# Patient Record
Sex: Male | Born: 1985 | Race: White | Hispanic: No | Marital: Single | Smoking: Never smoker
Health system: Southern US, Community
[De-identification: ages and names within clinical notes are randomized; demographics above are authoritative.]

## PROBLEM LIST (undated history)

## (undated) HISTORY — PX: CYSTECTOMY: SUR359

---
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
## 2007-07-07 ENCOUNTER — Emergency Department (HOSPITAL_COMMUNITY): Admission: EM | Admit: 2007-07-07 | Discharge: 2007-07-07 | Payer: Self-pay | Admitting: Family Medicine

## 2008-12-07 ENCOUNTER — Emergency Department (HOSPITAL_COMMUNITY): Admission: EM | Admit: 2008-12-07 | Discharge: 2008-12-07 | Payer: Self-pay | Admitting: Family Medicine

## 2009-03-29 ENCOUNTER — Emergency Department (HOSPITAL_COMMUNITY): Admission: EM | Admit: 2009-03-29 | Discharge: 2009-03-29 | Payer: Self-pay | Admitting: Family Medicine

## 2009-11-03 ENCOUNTER — Emergency Department (HOSPITAL_COMMUNITY): Admission: EM | Admit: 2009-11-03 | Discharge: 2009-11-03 | Payer: Self-pay | Admitting: Emergency Medicine

## 2010-03-09 ENCOUNTER — Emergency Department (HOSPITAL_COMMUNITY): Admission: EM | Admit: 2010-03-09 | Discharge: 2010-03-09 | Payer: Self-pay | Admitting: Emergency Medicine

## 2011-02-17 ENCOUNTER — Emergency Department (HOSPITAL_COMMUNITY): Payer: Managed Care, Other (non HMO)

## 2011-02-17 ENCOUNTER — Emergency Department (HOSPITAL_COMMUNITY)
Admission: EM | Admit: 2011-02-17 | Discharge: 2011-02-17 | Disposition: A | Discharge status: Home / Self Care | Payer: Managed Care, Other (non HMO) | Attending: Emergency Medicine | Admitting: Emergency Medicine

## 2011-02-17 DIAGNOSIS — Z23 Encounter for immunization: Secondary | ICD-10-CM | POA: Insufficient documentation

## 2011-02-17 DIAGNOSIS — W010XXA Fall on same level from slipping, tripping and stumbling without subsequent striking against object, initial encounter: Secondary | ICD-10-CM | POA: Insufficient documentation

## 2011-02-17 DIAGNOSIS — M25529 Pain in unspecified elbow: Secondary | ICD-10-CM | POA: Insufficient documentation

## 2011-02-17 DIAGNOSIS — S6990XA Unspecified injury of unspecified wrist, hand and finger(s), initial encounter: Secondary | ICD-10-CM | POA: Insufficient documentation

## 2011-02-17 DIAGNOSIS — Y9239 Other specified sports and athletic area as the place of occurrence of the external cause: Secondary | ICD-10-CM | POA: Insufficient documentation

## 2011-02-17 DIAGNOSIS — S59909A Unspecified injury of unspecified elbow, initial encounter: Secondary | ICD-10-CM | POA: Insufficient documentation

## 2011-02-17 DIAGNOSIS — Y92838 Other recreation area as the place of occurrence of the external cause: Secondary | ICD-10-CM | POA: Insufficient documentation

## 2011-02-17 DIAGNOSIS — Y9364 Activity, baseball: Secondary | ICD-10-CM | POA: Insufficient documentation

## 2011-02-20 NOTE — Consult Note (Signed)
  NAMEDUJUAN, STANKOWSKI NO.:  [id number]  MEDICAL RECORD NO.:  [id number]  LOCATION:  [facility]                         FACILITY:  [facility]  PHYSICIAN:  Georges Lynch. Tery Hoeger, M.D.DATE OF BIRTH:  11-May-1986  DATE OF CONSULTATION: DATE OF DISCHARGE:  02/17/2011                                CONSULTATION   He is 25 years old.  HISTORY OF PRESENT ILLNESS:  I was called today on him on emergency call.  I was called from the [employer] stating that this patient was our patient and he was being transferred to [facility] and also had dislocated left elbow, so I immediately came over to see him.  In fact, I first saw him in the registration area, brought him back, got immediate x-rays and there was no dislocation or fracture.  PAST MEDICAL HISTORY:  Past history is well-documented.  PHYSICAL EXAMINATION:  The physical exam from the orthopedic standpoint shows that he has a normal flexion and extension of his elbow, normal pronation and supination.  He is just little tender and has superficial abrasion over the extensor aspect of his ulna.  There is no deep lacerations.  He has a good radial pulse.  He has good circulation and good finger function.  No wrist or hand problems.  He has no shoulder injuries.  Multiple x-rays of the left elbow were negative.  IMPRESSION:  Contusion with superficial abrasion of left elbow.  TREATMENT:  We cleaned his elbow up with some Betadine, Neosporin dressing.  Ordered a tetanus toxoid shot 0.5 mL IM since his mother states she does not know when he had one last and he is not allergic. Gave him a sling and told him I would see him in the office Tuesday or prior do he has any problem.          ______________________________ Georges Lynch Darrelyn Hillock, M.D.     RAG/MEDQ  D:  02/17/2011  T:  02/17/2011  Job:  [id number]  Electronically Signed by Ranee Gosselin M.D. on 02/20/2011 02:44:15 PM

## 2011-04-01 ENCOUNTER — Inpatient Hospital Stay (INDEPENDENT_AMBULATORY_CARE_PROVIDER_SITE_OTHER)
Admission: RE | Admit: 2011-04-01 | Discharge: 2011-04-01 | Disposition: A | Discharge status: Home / Self Care | Payer: Managed Care, Other (non HMO) | Source: Ambulatory Visit | Attending: Family Medicine | Admitting: Family Medicine

## 2011-04-01 DIAGNOSIS — R51 Headache: Secondary | ICD-10-CM

## 2012-06-12 ENCOUNTER — Emergency Department (INDEPENDENT_AMBULATORY_CARE_PROVIDER_SITE_OTHER)
Admission: EM | Admit: 2012-06-12 | Discharge: 2012-06-12 | Disposition: A | Discharge status: Nursing Home (Includes ICF) | Payer: Managed Care, Other (non HMO) | Source: Home / Self Care | Attending: Family Medicine | Admitting: Family Medicine

## 2012-06-12 ENCOUNTER — Encounter (HOSPITAL_COMMUNITY): Payer: Self-pay | Admitting: Family Medicine

## 2012-06-12 ENCOUNTER — Emergency Department (HOSPITAL_COMMUNITY)
Admission: EM | Admit: 2012-06-12 | Discharge: 2012-06-12 | Disposition: A | Discharge status: Home / Self Care | Payer: Managed Care, Other (non HMO) | Attending: Emergency Medicine | Admitting: Emergency Medicine

## 2012-06-12 ENCOUNTER — Encounter (HOSPITAL_COMMUNITY): Payer: Self-pay | Admitting: Emergency Medicine

## 2012-06-12 DIAGNOSIS — R109 Unspecified abdominal pain: Secondary | ICD-10-CM

## 2012-06-12 DIAGNOSIS — R1033 Periumbilical pain: Secondary | ICD-10-CM | POA: Insufficient documentation

## 2012-06-12 LAB — COMPREHENSIVE METABOLIC PANEL
ALT: 19 U/L (ref 0–53)
AST: 21 U/L (ref 0–37)
CO2: 27 mEq/L (ref 19–32)
Calcium: 10.9 mg/dL — ABNORMAL HIGH (ref 8.4–10.5)
Chloride: 101 mEq/L (ref 96–112)
Creatinine, Ser: 0.82 mg/dL (ref 0.50–1.35)
GFR calc Af Amer: >90 mL/min (ref >90)
GFR calc non Af Amer: >90 mL/min (ref >90)
Glucose, Bld: 97 mg/dL (ref 70–99)
Total Bilirubin: 0.8 mg/dL (ref 0.3–1.2)

## 2012-06-12 LAB — CBC WITH DIFFERENTIAL/PLATELET
Basophils Absolute: 0 10*3/uL (ref 0.0–0.1)
Eosinophils Relative: 0 % (ref 0–5)
HCT: 47.7 % (ref 39.0–52.0)
Hemoglobin: 17.2 g/dL — ABNORMAL HIGH (ref 13.0–17.0)
Lymphocytes Relative: 18 % (ref 12–46)
Lymphs Abs: 1.4 10*3/uL (ref 0.7–4.0)
MCV: 81.8 fL (ref 78.0–100.0)
Monocytes Absolute: 0.5 10*3/uL (ref 0.1–1.0)
Neutro Abs: 6 10*3/uL (ref 1.7–7.7)
RBC: 5.83 MIL/uL — ABNORMAL HIGH (ref 4.22–5.81)
WBC: 8 10*3/uL (ref 4.0–10.5)

## 2012-06-12 LAB — URINALYSIS, ROUTINE W REFLEX MICROSCOPIC
Glucose, UA: NEGATIVE mg/dL
Hgb urine dipstick: NEGATIVE
Leukocytes, UA: NEGATIVE
Specific Gravity, Urine: 1.014 (ref 1.005–1.030)
pH: 6.5 (ref 5.0–8.0)

## 2012-06-12 MED ORDER — HYDROCODONE-ACETAMINOPHEN 5-325 MG PO TABS: ORAL_TABLET | ORAL | Status: DC

## 2012-06-12 MED ORDER — ONDANSETRON 8 MG PO TBDP: 8.0000 mg | ORAL_TABLET | Freq: Three times a day (TID) | ORAL | Status: DC | PRN

## 2012-06-12 MED ORDER — ONDANSETRON 4 MG PO TBDP
8.0000 mg | ORAL_TABLET | Freq: Once | ORAL | Status: AC
  Administered 2012-06-12: 8 mg via ORAL
  Filled 2012-06-12: qty 2

## 2012-06-12 NOTE — ED Notes (Signed)
Pt presents to department for evaluation of diffuse abdominal pain and N/V/D. Onset yesterday afternoon @3 :00pm. Denies pain at the time. Denies urinary symptoms. No signs of distress noted at present.

## 2012-06-12 NOTE — ED Provider Notes (Signed)
History     CSN: [id number]  Arrival date & time 06/12/12  0830   First MD Initiated Contact with Patient 06/12/12 [phone]      Chief Complaint  Patient presents with  . Abdominal Pain    (Consider location/radiation/quality/duration/timing/severity/associated sxs/prior treatment) HPI Comments: 26 year old male no significant past medical history. Here complaining of periumbilical abdominal pain since last evening. Symptoms associated with nausea, one episode of emesis and one small nonbloody diarrhea last night. Decreased appetite, no food or fluid intake today due to nausea. Denies fever, dysuria or hematuria. Denies recent alcohol intake. Denies history of jaundice.     History reviewed. No pertinent past medical history.  History reviewed. No pertinent past surgical history.  History reviewed. No pertinent family history.  History  Substance Use Topics  . Smoking status: Never Smoker   . Smokeless tobacco: Not on file  . Alcohol Use: No      Review of Systems  Constitutional: Positive for appetite change. Negative for fever and chills.  HENT: Negative for sore throat.   Respiratory: Negative for cough and shortness of breath.   Gastrointestinal: Positive for nausea, vomiting, abdominal pain and diarrhea.  Genitourinary: Negative for dysuria and hematuria.  Musculoskeletal: Negative for back pain.  Skin: Negative for rash.  Neurological: Negative for dizziness and headaches.    Allergies  Review of patient's allergies indicates no known allergies.  Home Medications  No current outpatient prescriptions on file.  BP 132/97  Pulse 63  Temp 98.3 F (36.8 C) (Oral)  Resp 16  SpO2 100%  Physical Exam  Nursing note and vitals reviewed. Constitutional: He is oriented to person, place, and time. He appears well-developed and well-nourished.       Laying in bed.  HENT:  Mouth/Throat: Oropharynx is clear and moist. No oropharyngeal exudate.  Eyes: No scleral  icterus.  Neck: No JVD present.  Cardiovascular: Normal rate, regular rhythm and normal heart sounds.  Exam reveals no gallop and no friction rub.   No murmur heard. Pulmonary/Chest: Effort normal and breath sounds normal. No respiratory distress. He has no wheezes. He has no rales.  Abdominal: He exhibits no distension and no mass.       Abdomen is soft. Reported tenderness to deep palpation in periumbilical and tenderness and guarding in right periumbilical area and right lower quadrant. No rebound. Impress mildly increased BS.   Lymphadenopathy:    He has no cervical adenopathy.  Neurological: He is alert and oriented to person, place, and time.  Skin: No rash noted. He is not diaphoretic.    ED Course  Procedures (including critical care time)  Labs Reviewed - No data to display No results found.   1. Abdominal pain       MDM  26 year old male here complaining of periumbilical abdominal pain associated with nausea, one episode of emesis and one small nonbloody diarrhea last night. Decreased appetite, no food or fluid intake today due to nausea. On exam: Afebrile, stable vital signs. Tender to deep palpation in the periumbilical area with guarding in right periumbilical area and right lower quadrant. No rebound. Normal active bowel sounds. Decided to transfer to the emergency department for further evaluation and management.     Sharin Grave, MD 06/12/12 1020

## 2012-06-12 NOTE — ED Notes (Signed)
Pt c/o lower abdominal pain since yesterday evening at 3 p.m. Pt states that he feels extremely nauseas and has no appetite and not able to sleep. He has had some diarrhea that started last night. Pt denies fever and any other symptoms.  Pt is also concerned about a knot by his left ear. No pain or soreness, it comes and goes. Pt states that it has been a problem for a while now.

## 2012-06-12 NOTE — ED Notes (Signed)
Pt sent here from [facility] for further eval of abdominal pain N,D.

## 2012-06-12 NOTE — ED Provider Notes (Signed)
History     CSN: [id number]  Arrival date & time 06/12/12  1030   First MD Initiated Contact with Patient 06/12/12 1054      Chief Complaint  Patient presents with  . Abdominal Pain    (Consider location/radiation/quality/duration/timing/severity/associated sxs/prior treatment) HPI Comments: Patient presents with periumbilical abdominal pain that began last evening. His been associated with nausea, dry heaving, and 3-4 episodes of watery diarrhea. Patient denies history of abdominal surgeries. Patient denies fever, cold symptoms, chest pain or shortness of breath, urinary symptoms, hematuria. No treatments prior to arrival. Pain does not radiate. Onset gradual. Course is now gradually improving. Patient states that the pain is better than it was this morning.  The history is provided by the patient.    History reviewed. No pertinent past medical history.  History reviewed. No pertinent past surgical history.  History reviewed. No pertinent family history.  History  Substance Use Topics  . Smoking status: Never Smoker   . Smokeless tobacco: Not on file  . Alcohol Use: No      Review of Systems  Constitutional: Negative for fever.  HENT: Negative for sore throat and rhinorrhea.   Eyes: Negative for redness.  Respiratory: Negative for cough.   Cardiovascular: Negative for chest pain.  Gastrointestinal: Positive for nausea, abdominal pain and diarrhea. Negative for vomiting.  Genitourinary: Negative for dysuria and hematuria.  Musculoskeletal: Negative for myalgias.  Skin: Negative for rash.  Neurological: Negative for headaches.    Allergies  Review of patient's allergies indicates no known allergies.  Home Medications  No current outpatient prescriptions on file.  BP 146/89  Pulse 65  Temp 97.8 F (36.6 C) (Oral)  Resp 16  SpO2 100%  Physical Exam  Nursing note and vitals reviewed. Constitutional: He appears well-developed and well-nourished.  HENT:    Head: Normocephalic and atraumatic.  Eyes: Conjunctivae normal are normal. Right eye exhibits no discharge. Left eye exhibits no discharge.  Neck: Normal range of motion. Neck supple.  Cardiovascular: Normal rate, regular rhythm and normal heart sounds.   Pulmonary/Chest: Effort normal and breath sounds normal.  Abdominal: Soft. Bowel sounds are normal. There is tenderness. There is no rebound and no guarding.         Mild periumbilical pain and suprapubic pain on my exam. Neg psoas and internal obturator signs.   Neurological: He is alert.  Skin: Skin is warm and dry.  Psychiatric: He has a normal mood and affect.    ED Course  Procedures (including critical care time)  Labs Reviewed  CBC WITH DIFFERENTIAL - Abnormal; Notable for the following:    RBC 5.83 (*)     Hemoglobin 17.2 (*)     MCHC 36.1 (*)     All other components within normal limits  COMPREHENSIVE METABOLIC PANEL - Abnormal; Notable for the following:    Calcium 10.9 (*)     All other components within normal limits  URINALYSIS, ROUTINE W REFLEX MICROSCOPIC  LIPASE, BLOOD   No results found.   1. Abdominal pain     12:36 PM Patient seen and examined. Work-up initiated. Medications ordered.   Vital signs reviewed and are as follows: Filed Vitals:   06/12/12 1038  BP: 146/89  Pulse: 65  Temp: 97.8 F (36.6 C)  Resp: 16   12:54 PM Pt discussed with and seen by Dr. Estell Harpin. Will give zofran and PO challenge. Pt to return in 24 hrs for recheck or sooner if symptoms worsening. Patient is comfortable with  plan.   Discussed at length that abdominal pain can be an evolving process that needs close follow-up. Patient verbalizes understanding.   MDM  Abdominal pain -- non-localized, mild, improving. Labs reassuring. Exam is not very impressive. Feel patient is appropriate and reliable for 24 hour recheck. Feel probability of appendicitis, colitis is low, but not entirely ruled out. Pt given strict return  instructions and agrees to return if worsening.         Renne Crigler, [place] 06/12/12 1531

## 2012-06-14 NOTE — ED Provider Notes (Signed)
Medical screening examination/treatment/procedure(s) were conducted as a shared visit with non-physician practitioner(s) and myself.  I personally evaluated the patient during the encounter Pt with abd pain.  pe mild periumbilical tenderness  Benny Lennert, MD 06/14/12 1622

## 2016-12-10 ENCOUNTER — Emergency Department (HOSPITAL_COMMUNITY)
Admission: EM | Admit: 2016-12-10 | Discharge: 2016-12-10 | Disposition: A | Discharge status: Home / Self Care | Payer: Managed Care, Other (non HMO) | Attending: Emergency Medicine | Admitting: Emergency Medicine

## 2016-12-10 ENCOUNTER — Encounter (HOSPITAL_COMMUNITY): Payer: Self-pay | Admitting: Emergency Medicine

## 2016-12-10 ENCOUNTER — Emergency Department (HOSPITAL_COMMUNITY): Payer: Managed Care, Other (non HMO)

## 2016-12-10 DIAGNOSIS — R4182 Altered mental status, unspecified: Secondary | ICD-10-CM | POA: Insufficient documentation

## 2016-12-10 DIAGNOSIS — R569 Unspecified convulsions: Secondary | ICD-10-CM

## 2016-12-10 LAB — CBC
HEMATOCRIT: 42.7 % (ref 39.0–52.0)
HEMOGLOBIN: 14.8 g/dL (ref 13.0–17.0)
MCH: 28.6 pg (ref 26.0–34.0)
MCHC: 34.7 g/dL (ref 30.0–36.0)
MCV: 82.6 fL (ref 78.0–100.0)
Platelets: 209 10*3/uL (ref 150–400)
RBC: 5.17 MIL/uL (ref 4.22–5.81)
RDW: 12.6 % (ref 11.5–15.5)
WBC: 4.7 10*3/uL (ref 4.0–10.5)

## 2016-12-10 LAB — RAPID URINE DRUG SCREEN, HOSP PERFORMED
AMPHETAMINES: NOT DETECTED
BENZODIAZEPINES: NOT DETECTED
Barbiturates: NOT DETECTED
Cocaine: NOT DETECTED
OPIATES: NOT DETECTED
Tetrahydrocannabinol: NOT DETECTED

## 2016-12-10 LAB — URINALYSIS, ROUTINE W REFLEX MICROSCOPIC
Bilirubin Urine: NEGATIVE
GLUCOSE, UA: NEGATIVE mg/dL
HGB URINE DIPSTICK: NEGATIVE
Ketones, ur: NEGATIVE mg/dL
Leukocytes, UA: NEGATIVE
Nitrite: NEGATIVE
PH: 7 (ref 5.0–8.0)
PROTEIN: NEGATIVE mg/dL
SPECIFIC GRAVITY, URINE: 1.004 — AB (ref 1.005–1.030)

## 2016-12-10 LAB — BASIC METABOLIC PANEL
ANION GAP: 8 (ref 5–15)
BUN: <5 mg/dL — ABNORMAL LOW (ref 6–20)
CHLORIDE: 104 mmol/L (ref 101–111)
CO2: 25 mmol/L (ref 22–32)
CREATININE: 0.88 mg/dL (ref 0.61–1.24)
Calcium: 9.6 mg/dL (ref 8.9–10.3)
GFR calc non Af Amer: >60 mL/min (ref >60)
Glucose, Bld: 95 mg/dL (ref 65–99)
POTASSIUM: 4 mmol/L (ref 3.5–5.1)
Sodium: 137 mmol/L (ref 135–145)

## 2016-12-10 LAB — ETHANOL: Alcohol, Ethyl (B): <5 mg/dL (ref <5)

## 2016-12-10 MED ORDER — SODIUM CHLORIDE 0.9 % IV BOLUS (SEPSIS)
1000.0000 mL | Freq: Once | INTRAVENOUS | Status: AC
  Administered 2016-12-10: 1000 mL via INTRAVENOUS

## 2016-12-10 MED ORDER — ACETAMINOPHEN 500 MG PO TABS
1000.0000 mg | ORAL_TABLET | Freq: Once | ORAL | Status: AC
  Administered 2016-12-10: 1000 mg via ORAL
  Filled 2016-12-10: qty 2

## 2016-12-10 NOTE — ED Triage Notes (Signed)
Patient from work with [employer] after bystander states he was in the bathroom on his knees after "getting some bad news" and he fell over and started seizing.  Patient denies having a history of seizures.  On arrival patient was not responding to voice or touch and his limbs were flaccid.  After painful stimulus patient awoke immediately and asked "what happened?  I don't remember what happened."  Patient tearful at this time, alert and oriented to self.

## 2016-12-10 NOTE — ED Notes (Signed)
Pt tolerated PO fluids well  

## 2016-12-10 NOTE — ED Provider Notes (Signed)
[facility]-EMERGENCY DEPT Provider Note   CSN: [id number] Arrival date & time: 12/10/16  1150     History   Chief Complaint Chief Complaint  Patient presents with  . Seizures    HPI Clinton Tucker is a 31 y.o. male.  HPI   Patient brought in following witnessed seizure.  Per EMS pt was on his knees in bathroom after receiving bad news, he then fell over and seized.  Mother is with pt in room.  Pt reports he doesn't remember anything after going to work this morning.  He works at a call center.  Denies any recent illness, denies pain anywhere.  Denies using drugs or alcohol.   Level V caveat for possible post-ictal state.    History reviewed. No pertinent past medical history.  There are no active problems to display for this patient.   History reviewed. No pertinent surgical history.     Home Medications    Prior to Admission medications   Medication Sig Start Date End Date Taking? Authorizing Provider  HYDROcodone-acetaminophen (NORCO/VICODIN) 5-325 MG per tablet Take 1-2 tablets every 6 hours as needed for severe pain Patient not taking: Reported on 12/10/2016 06/12/12   Renne Crigler, PA-C  ondansetron (ZOFRAN ODT) 8 MG disintegrating tablet Take 1 tablet (8 mg total) by mouth every 8 (eight) hours as needed for nausea. Patient not taking: Reported on 12/10/2016 06/12/12   Renne Crigler, PA-C    Family History No family history on file.  Social History Social History  Substance Use Topics  . Smoking status: Never Smoker  . Smokeless tobacco: Not on file  . Alcohol use No     Allergies   Patient has no known allergies.   Review of Systems Review of Systems  Unable to perform ROS: Mental status change     Physical Exam Updated Vital Signs BP 140/84   Pulse 68   Temp 98.8 F (37.1 C) (Oral)   Resp 16   SpO2 100%   Physical Exam  Constitutional: He appears well-developed and well-nourished. No distress.  HENT:  Head: Normocephalic and atraumatic.  Neck:  Neck supple.  Cardiovascular: Normal rate and regular rhythm.   Pulmonary/Chest: Effort normal and breath sounds normal. No respiratory distress. He has no wheezes. He has no rales.  Abdominal: Soft. He exhibits no distension and no mass. There is no tenderness. There is no rebound and no guarding.  Neurological: He is alert. He exhibits normal muscle tone.  Does not know where he is,  Does know he is in [place].  Thinks it is Wednesday, October, 2014.    Skin: He is not diaphoretic.  Nursing note and vitals reviewed.    ED Treatments / Results  Labs (all labs ordered are listed, but only abnormal results are displayed) Labs Reviewed  BASIC METABOLIC PANEL - Abnormal; Notable for the following:       Result Value   BUN <5 (*)    All other components within normal limits  URINALYSIS, ROUTINE W REFLEX MICROSCOPIC - Abnormal; Notable for the following:    Color, Urine STRAW (*)    Specific Gravity, Urine 1.004 (*)    All other components within normal limits  CBC  RAPID URINE DRUG SCREEN, HOSP PERFORMED  ETHANOL    EKG  EKG Interpretation None       Radiology Ct Head Wo Contrast  Result Date: 12/10/2016 CLINICAL DATA:  First time seizure this morning. EXAM: CT HEAD WITHOUT CONTRAST CT CERVICAL SPINE WITHOUT CONTRAST  TECHNIQUE: Multidetector CT imaging of the head and cervical spine was performed following the standard protocol without intravenous contrast. Multiplanar CT image reconstructions of the cervical spine were also generated. COMPARISON:  03/09/2010 FINDINGS: CT HEAD FINDINGS Brain: There is no evidence for acute hemorrhage, hydrocephalus, mass lesion, or abnormal extra-axial fluid collection. No definite CT evidence for acute infarction. Vascular: No hyperdense vessel or unexpected calcification. Skull: No evidence for fracture. No worrisome lytic or sclerotic lesion. Sinuses/Orbits: The visualized paranasal sinuses and mastoid air cells are clear. Visualized portions  of the globes and intraorbital fat are unremarkable. Other: None. CT CERVICAL SPINE FINDINGS Alignment: Preserved from the skullbase through the T1 vertebral body. Skull base and vertebrae: No fracture from the skullbase to the C7-T1 interspace. No worrisome lytic or sclerotic osseous abnormality. Soft tissues and spinal canal: No prevertebral fluid or swelling. No visible canal hematoma. Disc levels: Intervertebral disc spaces are preserved. Facets are well aligned bilaterally. Upper chest: Not visualized. Other: None. IMPRESSION: 1. No acute intracranial abnormality. 2. No evidence for cervical spine fracture. Electronically Signed   By: Kennith Center M.D.   On: 12/10/2016 13:16   Ct Cervical Spine Wo Contrast  Result Date: 12/10/2016 CLINICAL DATA:  First time seizure this morning. EXAM: CT HEAD WITHOUT CONTRAST CT CERVICAL SPINE WITHOUT CONTRAST TECHNIQUE: Multidetector CT imaging of the head and cervical spine was performed following the standard protocol without intravenous contrast. Multiplanar CT image reconstructions of the cervical spine were also generated. COMPARISON:  03/09/2010 FINDINGS: CT HEAD FINDINGS Brain: There is no evidence for acute hemorrhage, hydrocephalus, mass lesion, or abnormal extra-axial fluid collection. No definite CT evidence for acute infarction. Vascular: No hyperdense vessel or unexpected calcification. Skull: No evidence for fracture. No worrisome lytic or sclerotic lesion. Sinuses/Orbits: The visualized paranasal sinuses and mastoid air cells are clear. Visualized portions of the globes and intraorbital fat are unremarkable. Other: None. CT CERVICAL SPINE FINDINGS Alignment: Preserved from the skullbase through the T1 vertebral body. Skull base and vertebrae: No fracture from the skullbase to the C7-T1 interspace. No worrisome lytic or sclerotic osseous abnormality. Soft tissues and spinal canal: No prevertebral fluid or swelling. No visible canal hematoma. Disc levels:  Intervertebral disc spaces are preserved. Facets are well aligned bilaterally. Upper chest: Not visualized. Other: None. IMPRESSION: 1. No acute intracranial abnormality. 2. No evidence for cervical spine fracture. Electronically Signed   By: Kennith Center M.D.   On: 12/10/2016 13:16    Procedures Procedures (including critical care time)  Medications Ordered in ED Medications  sodium chloride 0.9 % bolus 1,000 mL (1,000 mLs Intravenous New Bag/Given 12/10/16 1519)  acetaminophen (TYLENOL) tablet 1,000 mg (1,000 mg Oral Given 12/10/16 1519)  sodium chloride 0.9 % bolus 1,000 mL (1,000 mLs Intravenous New Bag/Given 12/10/16 1531)     Initial Impression / Assessment and Plan / ED Course  I have reviewed the triage vital signs and the nursing notes.  Pertinent labs & imaging results that were available during my care of the patient were reviewed by me and considered in my medical decision making (see chart for details).  Clinical Course as of Dec 10 1613  Mon Dec 10, 2016  1358 Pt sleeping - woke up with sternal rub only.  Oriented now to location, date.  Denies any pain or other concerns currently.    [EW]  1431 Attempted to walk pt.  He fell directly forward, grabbing me and going to his knees.  His brother assisted me in putting him back  on the stretcher.  He is tearful, mother explains the "bad news" he got was that she and his father are "fussing."   [EW]  1607 Pt seen by Dr Patria Mane and ambulated with Dr Patria Mane.  Pt to be d/c home.    [EW]    Clinical Course User Index [EW] Trixie Dredge, PA-C    Afebrile nontoxic patient with reported witnessed seizure.  Pt was postictal afterwards but cleared with time.  Workup unremarkable.  Pt initially unable to stand and HR increased with orthostatic VS but IVF given with symptomatic improvement.  Pt also seen by Dr Patria Mane and pt discussed with him.  Pt and family advised of restrictions including no driving, no swimming, no baths, no dangerous activity  until cleared by neurology.  D/C home with neurology follow up.  Discussed result, findings, treatment, and follow up  with patient and mother (with patient's consent).  Pt given return precautions.    Final Clinical Impressions(s) / ED Diagnoses   Final diagnoses:  Seizure-like activity [facility])    New Prescriptions New Prescriptions   No medications on file     [place], PA-C 12/10/16 1618    Azalia Bilis, MD 12/10/16 1701

## 2016-12-10 NOTE — Discharge Instructions (Signed)
Read the information below.  You may return to the Emergency Department at any time for worsening condition or any new symptoms that concern you.  Do not drive, swim, take a bath, or do any activities that would be dangerous if you were to fall or lose consciousness until cleared by a neurologist.  Please see two neurology offices listed above, you may make an appointment with either.

## 2016-12-10 NOTE — ED Notes (Signed)
Patient transported to CT 

## 2016-12-10 NOTE — ED Notes (Signed)
Pt left without d/c paperwork. D/c paperwork held at triage. Pt ambulated with steady gait prior to d/c for Dr. Patria Mane. No scripts to review.

## 2016-12-24 ENCOUNTER — Ambulatory Visit: Payer: Self-pay | Admitting: Diagnostic Neuroimaging

## 2017-02-18 ENCOUNTER — Ambulatory Visit: Payer: Managed Care, Other (non HMO) | Admitting: Neurology

## 2019-02-21 IMAGING — CT CT CERVICAL SPINE W/O CM
4 of 7 series · 13 of 33 positions shown, 14 images · non-contrast
Comparison: 03/09/2010

CLINICAL DATA: First time seizure this morning.

EXAM:
CT HEAD WITHOUT CONTRAST
CT CERVICAL SPINE WITHOUT CONTRAST
TECHNIQUE: Multidetector CT imaging of the head and cervical spine was
performed following the standard protocol without intravenous
contrast. Multiplanar CT image reconstructions of the cervical spine
were also generated.

[Series 9: c_spine 2.0 st · axial · 0.26mm/px · z∈[-208,-100]mm · 4 of 91 slices shown, 5 images]
[im 19/91  soft-tissue]
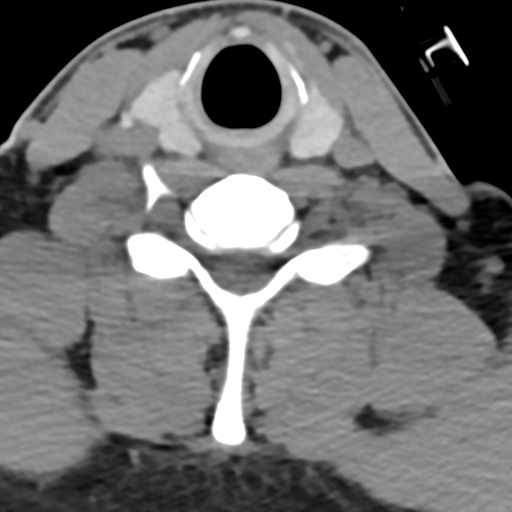
[im 19/91  bone]
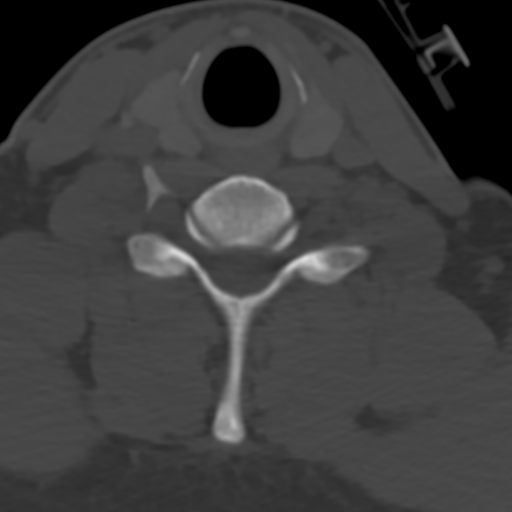
[im 37/91  bone]
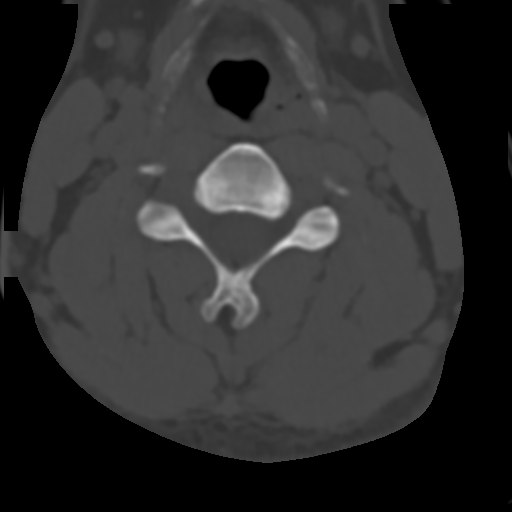
[im 55/91  bone]
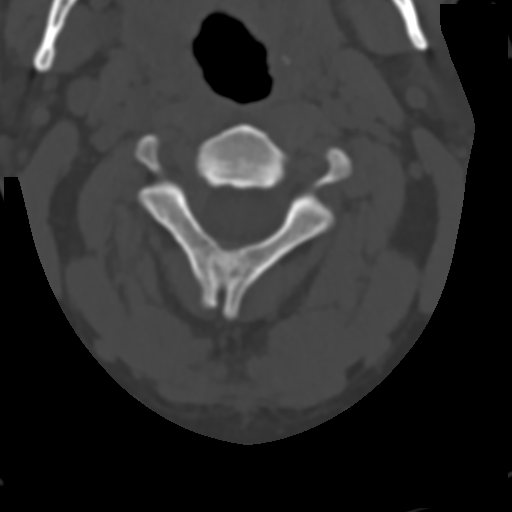
[im 73/91  bone]
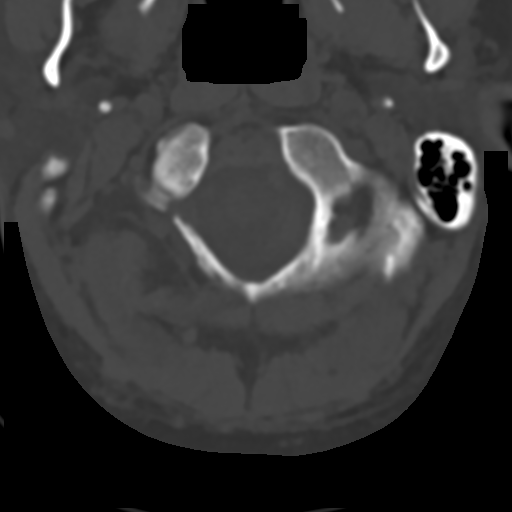

[Series 10: coronal bone · coronal · 0.23mm/px · 1 of 61 slices shown]
[im 31/61  bone]
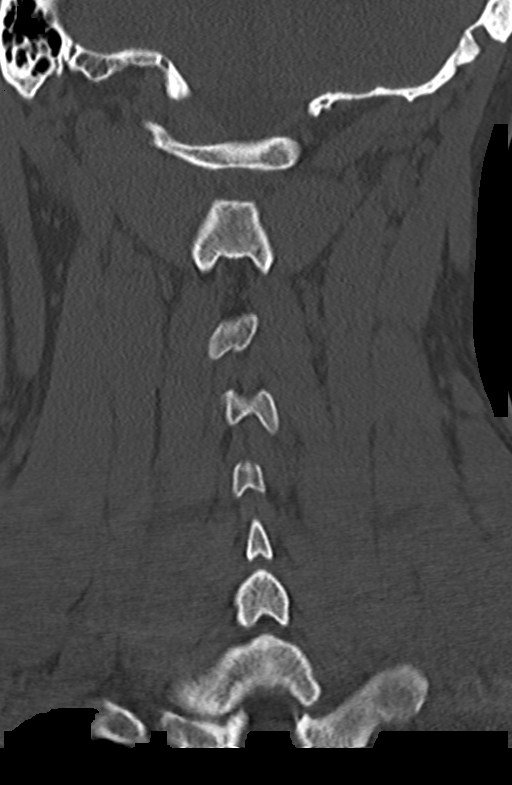

[Series 14: orthogonal axial st · axial · 0.21mm/px · z∈[-231,-134]mm · 4 of 84 slices shown]
[im 17/84  bone]
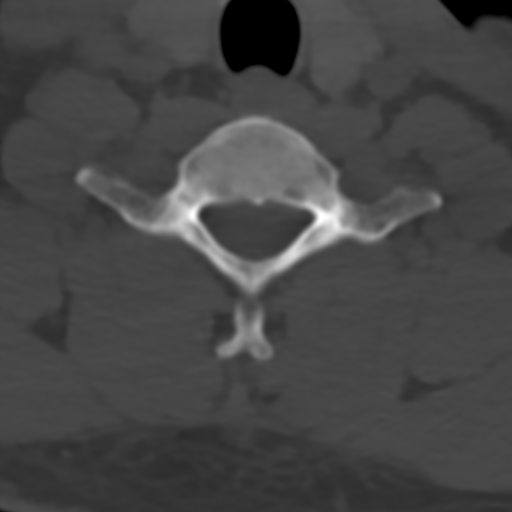
[im 34/84  bone]
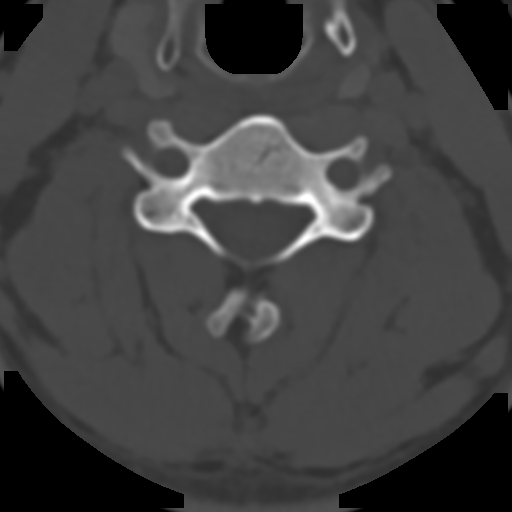
[im 50/84  bone]
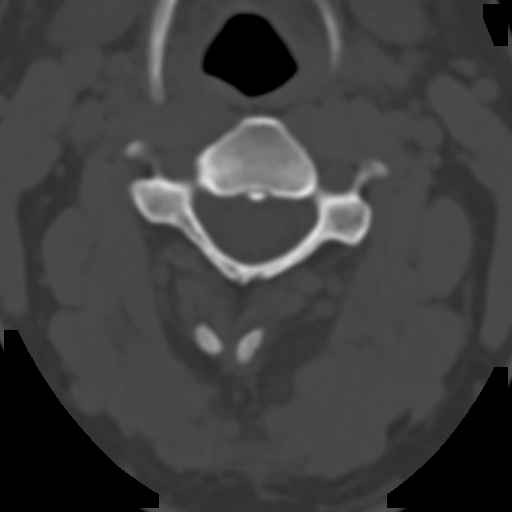
[im 67/84  bone]
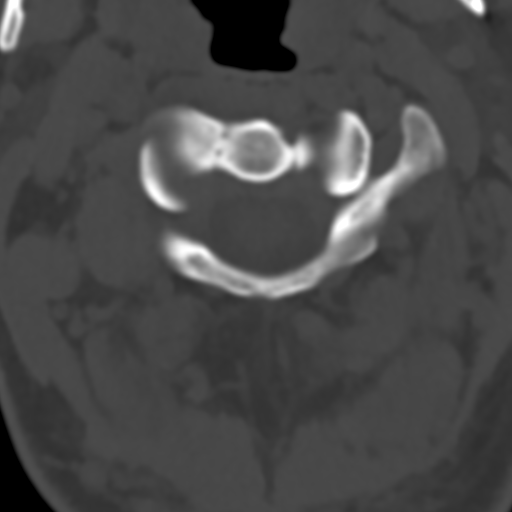

[Series 15: sagittal bone · sagittal · 0.23mm/px · 4 of 61 slices shown]
[im 13/61  bone]
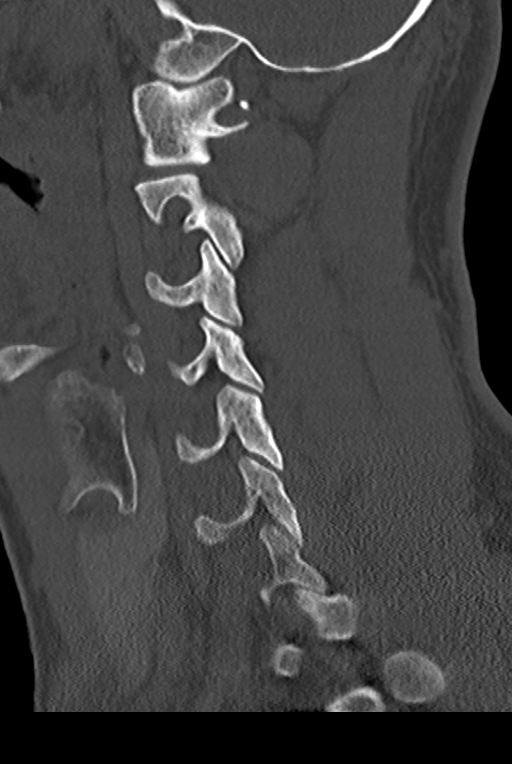
[im 25/61  bone]
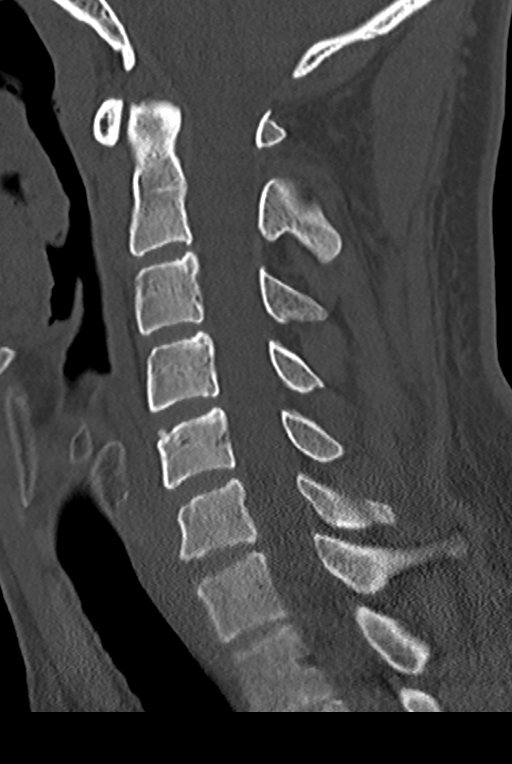
[im 37/61  bone]
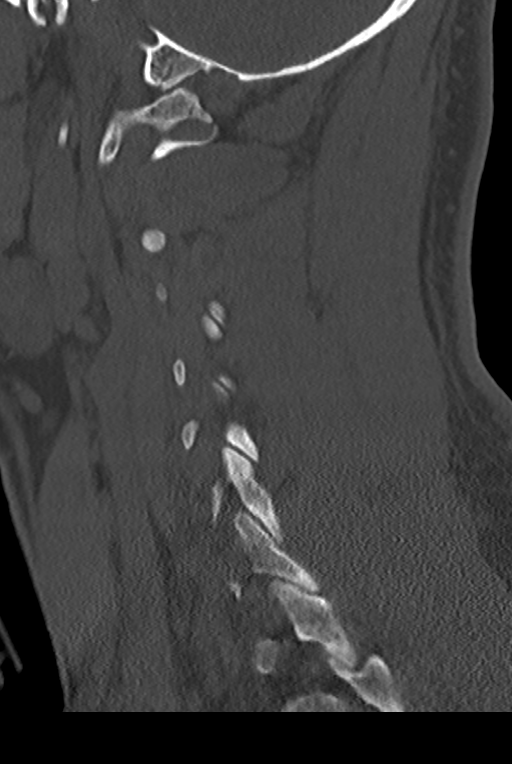
[im 49/61  bone]
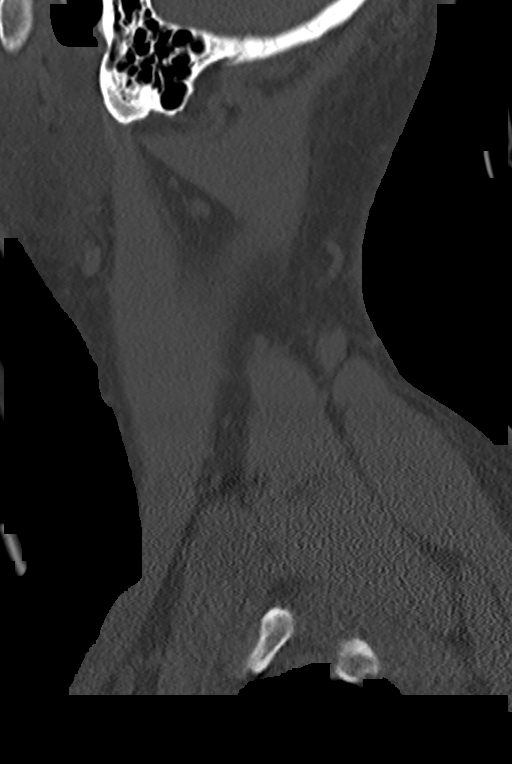

[13 of 33 positions shown; findings below may reference images not displayed]

FINDINGS: CT HEAD FINDINGS

Brain: There is no evidence for acute hemorrhage, hydrocephalus,
mass lesion, or abnormal extra-axial fluid collection. No definite
CT evidence for acute infarction.

Vascular: No hyperdense vessel or unexpected calcification.

Skull: No evidence for fracture. No worrisome lytic or sclerotic
lesion.

Sinuses/Orbits: The visualized paranasal sinuses and mastoid air
cells are clear. Visualized portions of the globes and intraorbital
fat are unremarkable.

Other: None.

CT CERVICAL SPINE FINDINGS

Alignment: Preserved from the skullbase through the T1 vertebral
body.

Skull base and vertebrae: No fracture from the skullbase to the
C7-T1 interspace. No worrisome lytic or sclerotic osseous
abnormality.

Soft tissues and spinal canal: No prevertebral fluid or swelling. No
visible canal hematoma.

Disc levels: Intervertebral disc spaces are preserved. Facets are
well aligned bilaterally.

Upper chest: Not visualized.

Other: None.
IMPRESSION: 1. No acute intracranial abnormality.
2. No evidence for cervical spine fracture.

## 2019-11-09 ENCOUNTER — Encounter (HOSPITAL_COMMUNITY): Payer: Self-pay

## 2019-11-09 ENCOUNTER — Other Ambulatory Visit: Payer: Self-pay

## 2019-11-09 ENCOUNTER — Ambulatory Visit (HOSPITAL_COMMUNITY)
Admission: EM | Admit: 2019-11-09 | Discharge: 2019-11-09 | Disposition: A | Discharge status: Home / Self Care | Payer: BC Managed Care – PPO | Attending: Family Medicine | Admitting: Family Medicine

## 2019-11-09 DIAGNOSIS — B9689 Other specified bacterial agents as the cause of diseases classified elsewhere: Secondary | ICD-10-CM

## 2019-11-09 DIAGNOSIS — L089 Local infection of the skin and subcutaneous tissue, unspecified: Secondary | ICD-10-CM

## 2019-11-09 DIAGNOSIS — L720 Epidermal cyst: Secondary | ICD-10-CM | POA: Diagnosis not present

## 2019-11-09 MED ORDER — DOXYCYCLINE HYCLATE 100 MG PO CAPS: 100.0000 mg | ORAL_CAPSULE | Freq: Two times a day (BID) | ORAL | 0 refills | Status: DC

## 2019-11-09 NOTE — ED Provider Notes (Signed)
[place]    CSN: [id number] Arrival date & time: 11/09/19  [place]      History   Chief Complaint Chief Complaint  Patient presents with  . Abscess    HPI Clinton Tucker is a 34 y.o. male.   HPI Patient has a cyst in front of his left ear this been present about 10 years.  He has had as well as abdomen that he says has been there for many years as well.  He now has 1 on the right side of his nose that is swollen and painful.  Is causing redness and swelling on the right side of his face.  He thinks that he may have a skin infection.  No drainage.  No fever or chills.  History reviewed. No pertinent past medical history.  There are no problems to display for this patient.   History reviewed. No pertinent surgical history.     Home Medications    Prior to Admission medications   Medication Sig Start Date End Date Taking? Authorizing Provider  doxycycline (VIBRAMYCIN) 100 MG capsule Take 1 capsule (100 mg total) by mouth 2 (two) times daily. 11/09/19   Raylene Everts, MD    Family History Family History  Problem Relation Age of Onset  . Diabetes Mother   . Hypertension Mother   . Diabetes Father   . Hypertension Father     Social History Social History   Tobacco Use  . Smoking status: Never Smoker  . Smokeless tobacco: Never Used  Substance Use Topics  . Alcohol use: Yes  . Drug use: Never     Allergies   Patient has no known allergies.   Review of Systems Review of Systems  Skin: Positive for wound.     Physical Exam Triage Vital Signs ED Triage Vitals [11/09/19 1848]  Enc Vitals Group     BP (!) 146/103     Pulse Rate 88     Resp 18     Temp 98.8 F (37.1 C)     Temp Source Oral     SpO2 96 %     Weight      Height      Head Circumference      Peak Flow      Pain Score 4     Pain Loc      Pain Edu?      Excl. in [place]?    No data found.  Updated Vital Signs BP (!) 146/103 (BP Location: Right Arm)   Pulse 88   Temp  98.8 F (37.1 C) (Oral)   Resp 18   SpO2 96%   Visual Acuity Right Eye Distance:   Left Eye Distance:   Bilateral Distance:    Right Eye Near:   Left Eye Near:    Bilateral Near:     Physical Exam Constitutional:      General: He is not in acute distress.    Appearance: Normal appearance. He is well-developed.  HENT:     Head: Normocephalic and atraumatic.      Comments: Area of erythema, mild soft tissue swelling of the right cheek that is 3 x 4 cm.  Induration in the center.  No fluctuance.  Lesion cyst in front of the left ear, 2-1/2 cm across.  Soft.  No infection or erythema Eyes:     Conjunctiva/sclera: Conjunctivae normal.     Pupils: Pupils are equal, round, and reactive to light.  Cardiovascular:  Rate and Rhythm: Normal rate.  Pulmonary:     Effort: Pulmonary effort is normal. No respiratory distress.  Abdominal:     General: There is no distension.     Palpations: Abdomen is soft.  Musculoskeletal:        General: Normal range of motion.     Cervical back: Normal range of motion.  Skin:    General: Skin is warm and dry.  Neurological:     Mental Status: He is alert.      UC Treatments / Results  Labs (all labs ordered are listed, but only abnormal results are displayed) Labs Reviewed - No data to display  EKG   Radiology No results found.  Procedures Procedures (including critical care time)  Medications Ordered in UC Medications - No data to display  Initial Impression / Assessment and Plan / UC Course  I have reviewed the triage vital signs and the nursing notes.  Pertinent labs & imaging results that were available during my care of the patient were reviewed by me and considered in my medical decision making (see chart for details).     Reviewed that the inclusion cyst do not cause cancer.  They do not need to be removed unless they are cosmetically unappealing or large and uncomfortable. Final Clinical Impressions(s) / UC Diagnoses    Final diagnoses:  Epidermal inclusion cyst  Skin infection, bacterial     Discharge Instructions     Take the antibiotic 2 x a day See dermatology or surgeon for cyst removal Your blood pressure is mildly elevated.  I recommend following up with your primary care doctor    ED Prescriptions    Medication Sig Dispense Auth. Provider   doxycycline (VIBRAMYCIN) 100 MG capsule Take 1 capsule (100 mg total) by mouth 2 (two) times daily. 14 capsule Eustace Moore, MD     PDMP not reviewed this encounter.   Eustace Moore, MD 11/09/19 [phone]

## 2019-11-09 NOTE — ED Triage Notes (Signed)
Pt c/o abscess right side of nose/face x1 week with purulent, bloody drainage. Also c/o swelling to face left tragus area for approx 10 years that has drained in the past.  Also c/o hardened swelling/abscess to left umbilical area for several years. Denies fever, chills.

## 2019-11-09 NOTE — Discharge Instructions (Addendum)
Take the antibiotic 2 x a day See dermatology or surgeon for cyst removal Your blood pressure is mildly elevated.  I recommend following up with your primary care doctor

## 2020-06-23 ENCOUNTER — Ambulatory Visit (INDEPENDENT_AMBULATORY_CARE_PROVIDER_SITE_OTHER): Payer: BC Managed Care – PPO | Admitting: Otolaryngology

## 2020-06-23 ENCOUNTER — Other Ambulatory Visit: Payer: Self-pay

## 2020-06-23 ENCOUNTER — Encounter (INDEPENDENT_AMBULATORY_CARE_PROVIDER_SITE_OTHER): Payer: Self-pay | Admitting: Otolaryngology

## 2020-06-23 VITALS — Temp 97.7°F

## 2020-06-23 DIAGNOSIS — L720 Epidermal cyst: Secondary | ICD-10-CM | POA: Diagnosis not present

## 2020-06-23 NOTE — Progress Notes (Signed)
HPI: Clinton Tucker is a 34 y.o. male who presents is referred by [facility] dermatology for evaluation of left facial cyst.  Patient states that he has had a cyst on the left side of his face for several years but recently became infected and swelled up having to be drained in the ED.  He was treated with antibiotics.  He is referred here to have it possibly removed.  He is otherwise healthy..  No past medical history on file. No past surgical history on file. Social History   Socioeconomic History  . Marital status: Single    Spouse name: Not on file  . Number of children: Not on file  . Years of education: Not on file  . Highest education level: Not on file  Occupational History  . Not on file  Tobacco Use  . Smoking status: Never Smoker  . Smokeless tobacco: Never Used  Vaping Use  . Vaping Use: Never used  Substance and Sexual Activity  . Alcohol use: Yes  . Drug use: Never  . Sexual activity: Not on file  Other Topics Concern  . Not on file  Social History Narrative  . Not on file   Social Determinants of Health   Financial Resource Strain:   . Difficulty of Paying Living Expenses: Not on file  Food Insecurity:   . Worried About Programme researcher, broadcasting/film/video in the Last Year: Not on file  . Ran Out of Food in the Last Year: Not on file  Transportation Needs:   . Lack of Transportation (Medical): Not on file  . Lack of Transportation (Non-Medical): Not on file  Physical Activity:   . Days of Exercise per Week: Not on file  . Minutes of Exercise per Session: Not on file  Stress:   . Feeling of Stress : Not on file  Social Connections:   . Frequency of Communication with Friends and Family: Not on file  . Frequency of Social Gatherings with Friends and Family: Not on file  . Attends Religious Services: Not on file  . Active Member of Clubs or Organizations: Not on file  . Attends Banker Meetings: Not on file  . Marital Status: Not on file   Family History   Problem Relation Age of Onset  . Diabetes Mother   . Hypertension Mother   . Diabetes Father   . Hypertension Father    No Known Allergies Prior to Admission medications   Medication Sig Start Date End Date Taking? Authorizing Provider  doxycycline (VIBRAMYCIN) 100 MG capsule Take 1 capsule (100 mg total) by mouth 2 (two) times daily. Patient not taking: Reported on 06/23/2020 11/09/19   Eustace Moore, MD     Positive ROS: Otherwise negative  All other systems have been reviewed and were otherwise negative with the exception of those mentioned in the HPI and as above.  Physical Exam: Constitutional: Alert, well-appearing, no acute distress Ears: External ears without lesions or tenderness. Ear canals are clear bilaterally with intact, clear TMs.  Nasal: External nose without lesions. Clear nasal passages Oral: Lips and gums without lesions. Tongue and palate mucosa without lesions. Posterior oropharynx clear. Neck: No palpable adenopathy or masses Respiratory: Breathing comfortably  Skin: No facial/neck lesions or rash noted.  Patient has a cyst in the left cheek area left temple area measuring approximately 2 cm is fairly flat presently has is been recently drained.  Still has slight induration.  No real signs of persistent infection.  Procedures  Assessment: Left epidermal cyst consistent with sebaceous cyst  Plan: Discussed excision of this under local anesthetic in 2 weeks. He will follow-up in a week and a half for recheck on the Wednesday before probable Friday excision. Gave him a prescription for doxycycline to use if he develops any erythema or swelling.   Narda Bonds, MD   CC:

## 2020-07-06 ENCOUNTER — Ambulatory Visit (INDEPENDENT_AMBULATORY_CARE_PROVIDER_SITE_OTHER): Payer: BC Managed Care – PPO | Admitting: Otolaryngology

## 2020-07-06 ENCOUNTER — Encounter (INDEPENDENT_AMBULATORY_CARE_PROVIDER_SITE_OTHER): Payer: Self-pay | Admitting: Otolaryngology

## 2020-07-06 ENCOUNTER — Other Ambulatory Visit: Payer: Self-pay

## 2020-07-06 DIAGNOSIS — L723 Sebaceous cyst: Secondary | ICD-10-CM | POA: Diagnosis not present

## 2020-07-06 NOTE — Progress Notes (Signed)
HPI: Clinton Tucker is a 34 y.o. male who returns today for evaluation of left facial cyst.  He had I&D performed at the ED a couple weeks ago.  He is doing much better presently.  He is having no pain and no swelling.Marland Kitchen  No past medical history on file. No past surgical history on file. Social History   Socioeconomic History  . Marital status: Single    Spouse name: Not on file  . Number of children: Not on file  . Years of education: Not on file  . Highest education level: Not on file  Occupational History  . Not on file  Tobacco Use  . Smoking status: Never Smoker  . Smokeless tobacco: Never Used  Vaping Use  . Vaping Use: Never used  Substance and Sexual Activity  . Alcohol use: Yes  . Drug use: Never  . Sexual activity: Not on file  Other Topics Concern  . Not on file  Social History Narrative  . Not on file   Social Determinants of Health   Financial Resource Strain:   . Difficulty of Paying Living Expenses: Not on file  Food Insecurity:   . Worried About Programme researcher, broadcasting/film/video in the Last Year: Not on file  . Ran Out of Food in the Last Year: Not on file  Transportation Needs:   . Lack of Transportation (Medical): Not on file  . Lack of Transportation (Non-Medical): Not on file  Physical Activity:   . Days of Exercise per Week: Not on file  . Minutes of Exercise per Session: Not on file  Stress:   . Feeling of Stress : Not on file  Social Connections:   . Frequency of Communication with Friends and Family: Not on file  . Frequency of Social Gatherings with Friends and Family: Not on file  . Attends Religious Services: Not on file  . Active Member of Clubs or Organizations: Not on file  . Attends Banker Meetings: Not on file  . Marital Status: Not on file   Family History  Problem Relation Age of Onset  . Diabetes Mother   . Hypertension Mother   . Diabetes Father   . Hypertension Father    No Known Allergies Prior to Admission medications    Medication Sig Start Date End Date Taking? Authorizing Provider  doxycycline (VIBRAMYCIN) 100 MG capsule Take 1 capsule (100 mg total) by mouth 2 (two) times daily. 11/09/19  Yes Eustace Moore, MD     Positive ROS: Otherwise negative  All other systems have been reviewed and were otherwise negative with the exception of those mentioned in the HPI and as above.  Physical Exam: Constitutional: Alert, well-appearing, no acute distress Ears: External ears without lesions or tenderness. Ear canals are clear bilaterally with intact, clear TMs.  Nasal: External nose without lesions. . Clear nasal passages Oral: Lips and gums without lesions. Tongue and palate mucosa without lesions. Posterior oropharynx clear. Neck: No palpable adenopathy or masses Respiratory: Breathing comfortably  Skin: No facial/neck lesions or rash noted.  On exam of the left face left temple area the incision site is well-healed with slight induration.  Has a small cyst inferior to this again which is fairly flat and slightly indurated.  This is nontender to palpation is not swollen.  No signs of infection.  Procedures  Assessment: Status post I&D of left facial cyst.  Doing well with no signs of infection  Plan: At this point most of this  represents scar tissue and there is no swelling or signs of infection would not recommend excision of this unless it enlarges.  He will call us back if it enlarges for excision prior to any infection and discussed this with him.   Narda Bonds, MD

## 2020-07-11 ENCOUNTER — Ambulatory Visit: Payer: Self-pay | Admitting: Surgery

## 2020-07-11 NOTE — H&P (Signed)
Clinton Tucker Appointment: 07/11/2020 2:30 PM Location: [facility] Patient #: [id number] DOB: [birth year] Single / Language: [place] / Race: White Male  History of Present Illness Clinton Sportsman MD; 07/11/2020 2:57 PM) The patient is a 34 year old male who presents with a soft tissue mass. Note for "Soft tissue mass": ` ` ` Patient sent for surgical consultation at the request of [facility]  Chief Complaint: Subcutaneous masses. ` ` The patient is a young male. Some anxiety. Wished his mother to be with him for the office visit. Patient has had a mass on his abdomen for the past 4 years. It has been slowly getting larger over time. He recently had a pectus cyst on his left cheek in front of his ear that required incision and drainage and emergency room with follow-up by Clinton Tucker with your nose and throat. Seems to be healing. Patient noted he had mass in his abdominal wall. Was sent to dermatology. They felt too large and deep to be excised the office. Surgical consultation offered. Patient has any history of any other infections. Does not smoke. No diabetes. Can walk a half hour without difficulty. No cardiac upon issues. Had an episode of head trauma and sports in 2015. Had 1 seizure during and emotionally stressful time does not have a history of chronic seizure disorders.   (Review of systems as stated in this history (HPI) or in the review of systems. Otherwise all other 12 point ROS are negative) ` ` ###########################################`  This patient encounter took 25 minutes today to perform the following: obtain history, perform exam, review outside records, interpret tests & imaging, counsel the patient on their diagnosis; and, document this encounter, including findings & plan in the electronic health record (EHR).   Past Surgical History (Clinton Tucker, CMA; 07/11/2020 2:28 PM) No pertinent past surgical  history  Diagnostic Studies History [facility] Lonni [facility], CMA; 07/11/2020 2:28 PM) Colonoscopy never  Allergies (Clinton Tucker, CMA; 07/11/2020 2:28 PM) No Known Drug Allergies [07/11/2020]: Allergies Reconciled  Medication History (Clinton Tucker, CMA; 07/11/2020 2:28 PM) No Current Medications Medications Reconciled  Social History Clinton Tucker, CMA; 07/11/2020 2:28 PM) Alcohol use Moderate alcohol use. Caffeine use Carbonated beverages, Coffee, Tea. No drug use Tobacco use Never smoker.  Family History Clinton Tucker, CMA; 07/11/2020 2:28 PM) Depression Mother. Diabetes Mellitus Father. Hypertension Father. Migraine Headache Mother.  Other Problems (Clinton Tucker, CMA; 07/11/2020 2:28 PM) Anxiety Disorder Depression Migraine Headache     Review of Systems (Clinton Tucker CMA; 07/11/2020 2:28 PM) General Not Present- Appetite Loss, Chills, Fatigue, Fever, Night Sweats, Weight Gain and Weight Loss. Skin Not Present- Change in Wart/Mole, Dryness, Hives, Jaundice, New Lesions, Non-Healing Wounds, Rash and Ulcer. HEENT Present- Wears glasses/contact lenses. Not Present- Earache, Hearing Loss, Hoarseness, Nose Bleed, Oral Ulcers, Ringing in the Ears, Seasonal Allergies, Sinus Pain, Sore Throat, Visual Disturbances and Yellow Eyes. Respiratory Not Present- Bloody sputum, Chronic Cough, Difficulty Breathing, Snoring and Wheezing. Breast Not Present- Breast Mass, Breast Pain, Nipple Discharge and Skin Changes. Cardiovascular Not Present- Chest Pain, Difficulty Breathing Lying Down, Leg Cramps, Palpitations, Rapid Heart Rate, Shortness of Breath and Swelling of Extremities. Gastrointestinal Not Present- Abdominal Pain, Bloating, Bloody Stool, Change in Bowel Habits, Chronic diarrhea, Constipation, Difficulty Swallowing, Excessive gas, Gets full quickly at meals, Hemorrhoids, Indigestion, Nausea, Rectal Pain and Vomiting. Male Genitourinary Not Present- Blood in Urine, Change in Urinary  Stream, Frequency, Impotence, Nocturia, Painful Urination, Urgency and Urine Leakage.  Vitals (Clinton Tucker CMA; 07/11/2020  2:29 PM) 07/11/2020 2:28 PM Weight: 215.38 lb Height: 69in Body Surface Area: 2.13 m Body Mass Index: 31.81 kg/m  Temp.: 98.86F  Pulse: 109 (Regular)  BP: 128/74(Sitting, Left Arm, Standard)        Physical Exam Clinton Sportsman MD; 07/11/2020 2:57 PM)  General Mental Status-Alert. General Appearance-Not in acute distress, Not Sickly. Orientation-Oriented X3. Hydration-Well hydrated. Voice-Normal.  Integumentary Global Assessment Upon inspection and palpation of skin surfaces of the - Axillae: non-tender, no inflammation or ulceration, no drainage. and Distribution of scalp and body hair is normal. General Characteristics Temperature - normal warmth is noted.  Head and Neck Head-normocephalic, atraumatic with no lesions or palpable masses. Face Global Assessment - atraumatic, no absence of expression. Neck Global Assessment - no abnormal movements, no bruit auscultated on the right, no bruit auscultated on the left, no decreased range of motion, non-tender. Trachea-midline. Thyroid Gland Characteristics - non-tender.  Eye Eyeball - Left-Extraocular movements intact, No Nystagmus - Left. Eyeball - Right-Extraocular movements intact, No Nystagmus - Right. Cornea - Left-No Hazy - Left. Cornea - Right-No Hazy - Right. Sclera/Conjunctiva - Left-No scleral icterus, No Discharge - Left. Sclera/Conjunctiva - Right-No scleral icterus, No Discharge - Right. Pupil - Left-Direct reaction to light normal. Pupil - Right-Direct reaction to light normal. Note: Wears glasses. Vision corrected  ENMT Ears Pinna - Left - no drainage observed, no generalized tenderness observed. Pinna - Right - no drainage observed, no generalized tenderness observed. Nose and Sinuses External Inspection of the Nose - no destructive  lesion observed. Inspection of the nares - Left - quiet respiration. Inspection of the nares - Right - quiet respiration. Mouth and Throat Lips - Upper Lip - no fissures observed, no pallor noted. Lower Lip - no fissures observed, no pallor noted. Nasopharynx - no discharge present. Oral Cavity/Oropharynx - Tongue - no dryness observed. Oral Mucosa - no cyanosis observed. Hypopharynx - no evidence of airway distress observed.  Chest and Lung Exam Inspection Movements - Normal and Symmetrical. Accessory muscles - No use of accessory muscles in breathing. Palpation Palpation of the chest reveals - Non-tender. Auscultation Breath sounds - Normal and Clear.  Cardiovascular Auscultation Rhythm - Regular. Murmurs & Other Heart Sounds - Auscultation of the heart reveals - No Murmurs and No Systolic Clicks.  Abdomen Inspection Inspection of the abdomen reveals - No Visible peristalsis and No Abnormal pulsations. Umbilicus - No Bleeding, No Urine drainage. Palpation/Percussion Palpation and Percussion of the abdomen reveal - Soft, Non Tender, No Rebound tenderness, No Rigidity (guarding) and No Cutaneous hyperesthesia. Note: Abdomen soft. Nontender. Not distended. No umbilical or incisional hernias. No guarding.  Male Genitourinary Sexual Maturity Tanner 5 - Adult hair pattern and Adult penile size and shape.  Peripheral Vascular Upper Extremity Inspection - Left - No Cyanotic nailbeds - Left, Not Ischemic. Inspection - Right - No Cyanotic nailbeds - Right, Not Ischemic.  Neurologic Neurologic evaluation reveals -normal attention span and ability to concentrate, able to name objects and repeat phrases. Appropriate fund of knowledge , normal sensation and normal coordination. Mental Status Affect - not angry, not paranoid. Cranial Nerves-Normal Bilaterally. Gait-Normal.  Neuropsychiatric Mental status exam performed with findings of-able to articulate well with normal  speech/language, rate, volume and coherence, thought content normal with ability to perform basic computations and apply abstract reasoning and no evidence of hallucinations, delusions, obsessions or homicidal/suicidal ideation.  Musculoskeletal Global Assessment Spine, Ribs and Pelvis - no instability, subluxation or laxity. Right Upper Extremity - no instability, subluxation or  laxity.  Lymphatic Head & Neck  General Head & Neck Lymphatics: Bilateral - Description - No Localized lymphadenopathy. Axillary  General Axillary Region: Bilateral - Description - No Localized lymphadenopathy. Femoral & Inguinal  Generalized Femoral & Inguinal Lymphatics: Left - Description - No Localized lymphadenopathy. Right - Description - No Localized lymphadenopathy.    Assessment & Plan Clinton Sportsman MD; 07/11/2020 2:57 PM)  ABDOMINAL WALL MASS OF LEFT UPPER QUADRANT (R19.02) Impression: Slowly enlarging ellipsoid abdominal wall mass fixed to the dermis. Perhaps a very large sebaceous cyst. Most likely lipoma. In fact that is of moderate size and getting larger, reasonable to remove. I think this is too big to safely remove under local anesthetic only. I am concerned about his ability to tolerate given his anxiety and other issues. Hopefully can be outpatient surgery just with some monitored sedation and local anesthetic. We will work to coordinate a convenient time.  Current Plans You are being scheduled for surgery- Our schedulers will call you.  You should hear from our office's scheduling department within 5 working days about the location, date, and time of surgery. We try to make accommodations for patient's preferences in scheduling surgery, but sometimes the OR schedule or the surgeon's schedule prevents [place] from making those accommodations.  If you have not heard from our office [phone]) in 5 working days, call the office and ask for your surgeon's nurse.  If you have other questions  about your diagnosis, plan, or surgery, call the office and ask for your surgeon's nurse.  The pathophysiology of skin & subcutaneous masses was discussed. Natural history risks without surgery were discussed. I recommended surgery to remove the mass. I explained the technique of removal with use of local anesthesia & possible need for more aggressive sedation/anesthesia for patient comfort.  Risks such as bleeding, infection, wound breakdown, heart attack, death, and other risks were discussed. I noted a good likelihood this will help address the problem. Possibility that this will not correct all symptoms was explained. Possibility of regrowth/recurrence of the mass was discussed. We will work to minimize complications. Questions were answered. The patient expresses understanding & wishes to proceed with surgery.  Clinton Sportsman, MD, FACS, MASCRS Gastrointestinal and Minimally Invasive Surgery  [facility] [address] [place], [place] [address] [phone] Fax [phone] Main/Paging  CONTACT INFORMATION: Weekday (9AM-5PM) concerns: Call [facility] main office at [phone] Weeknight (5PM-9AM) or Weekend/Holiday concerns: Check [email or web address] for General Surgery [facility] coverage (Please, do not use SecureChat as it is not reliable communication to operating surgeons for immediate patient care)

## 2020-08-12 ENCOUNTER — Telehealth (INDEPENDENT_AMBULATORY_CARE_PROVIDER_SITE_OTHER): Payer: Self-pay

## 2021-07-05 ENCOUNTER — Other Ambulatory Visit (INDEPENDENT_AMBULATORY_CARE_PROVIDER_SITE_OTHER): Payer: Self-pay | Admitting: Otolaryngology

## 2021-07-06 ENCOUNTER — Other Ambulatory Visit (INDEPENDENT_AMBULATORY_CARE_PROVIDER_SITE_OTHER): Payer: Self-pay | Admitting: Otolaryngology

## 2021-07-06 MED ORDER — DOXYCYCLINE HYCLATE 100 MG PO TABS: 100.0000 mg | ORAL_TABLET | Freq: Two times a day (BID) | ORAL | 0 refills | Status: DC

## 2022-03-30 ENCOUNTER — Encounter (HOSPITAL_COMMUNITY): Payer: Self-pay

## 2022-03-30 ENCOUNTER — Emergency Department (HOSPITAL_COMMUNITY): Payer: Self-pay

## 2022-03-30 ENCOUNTER — Emergency Department (HOSPITAL_COMMUNITY)
Admission: EM | Admit: 2022-03-30 | Discharge: 2022-03-30 | Disposition: A | Discharge status: Home / Self Care | Payer: Self-pay | Attending: Emergency Medicine | Admitting: Emergency Medicine

## 2022-03-30 ENCOUNTER — Other Ambulatory Visit: Payer: Self-pay

## 2022-03-30 DIAGNOSIS — F410 Panic disorder [episodic paroxysmal anxiety] without agoraphobia: Secondary | ICD-10-CM | POA: Insufficient documentation

## 2022-03-30 DIAGNOSIS — R569 Unspecified convulsions: Secondary | ICD-10-CM | POA: Insufficient documentation

## 2022-03-30 DIAGNOSIS — R55 Syncope and collapse: Secondary | ICD-10-CM | POA: Insufficient documentation

## 2022-03-30 DIAGNOSIS — R5383 Other fatigue: Secondary | ICD-10-CM | POA: Insufficient documentation

## 2022-03-30 DIAGNOSIS — R61 Generalized hyperhidrosis: Secondary | ICD-10-CM | POA: Insufficient documentation

## 2022-03-30 LAB — CBC WITH DIFFERENTIAL/PLATELET
Abs Immature Granulocytes: 0.01 10*3/uL (ref 0.00–0.07)
Basophils Absolute: 0 10*3/uL (ref 0.0–0.1)
Basophils Relative: 1 %
Eosinophils Absolute: 0.1 10*3/uL (ref 0.0–0.5)
Eosinophils Relative: 1 %
HCT: 44.9 % (ref 39.0–52.0)
Hemoglobin: 15.6 g/dL (ref 13.0–17.0)
Immature Granulocytes: 0 %
Lymphocytes Relative: 28 %
Lymphs Abs: 1.9 10*3/uL (ref 0.7–4.0)
MCH: 28.5 pg (ref 26.0–34.0)
MCHC: 34.7 g/dL (ref 30.0–36.0)
MCV: 81.9 fL (ref 80.0–100.0)
Monocytes Absolute: 0.5 10*3/uL (ref 0.1–1.0)
Monocytes Relative: 7 %
Neutro Abs: 4.4 10*3/uL (ref 1.7–7.7)
Neutrophils Relative %: 63 %
Platelets: 267 10*3/uL (ref 150–400)
RBC: 5.48 MIL/uL (ref 4.22–5.81)
RDW: 12.5 % (ref 11.5–15.5)
WBC: 6.9 10*3/uL (ref 4.0–10.5)
nRBC: 0 % (ref 0.0–0.2)

## 2022-03-30 LAB — BASIC METABOLIC PANEL
Anion gap: 8 (ref 5–15)
BUN: 12 mg/dL (ref 6–20)
CO2: 24 mmol/L (ref 22–32)
Calcium: 9.5 mg/dL (ref 8.9–10.3)
Chloride: 108 mmol/L (ref 98–111)
Creatinine, Ser: 0.79 mg/dL (ref 0.61–1.24)
GFR, Estimated: >60 mL/min (ref >60)
Glucose, Bld: 100 mg/dL — ABNORMAL HIGH (ref 70–99)
Potassium: 3.8 mmol/L (ref 3.5–5.1)
Sodium: 140 mmol/L (ref 135–145)

## 2022-03-30 LAB — CBG MONITORING, ED: Glucose-Capillary: 96 mg/dL (ref 70–99)

## 2022-03-30 LAB — TROPONIN I (HIGH SENSITIVITY): Troponin I (High Sensitivity): <2 ng/L (ref <18)

## 2022-03-30 MED ORDER — SODIUM CHLORIDE 0.9 % IV BOLUS
1000.0000 mL | Freq: Once | INTRAVENOUS | Status: AC
  Administered 2022-03-30: 1000 mL via INTRAVENOUS

## 2022-03-30 NOTE — ED Triage Notes (Signed)
Per EMS- patient was at work and EMS called with c/o panic attack. When EMS arrived, the patient had LOC x 2-3 minutes and then he came to.

## 2022-03-30 NOTE — Discharge Instructions (Addendum)
Your laboratory results are within normal limits.  Your x-ray of your chest showed no acute findings.CT Head showed no acute findings.   We will need to avoid any activity such as swimming, operating a vehicle until you are evaluated by neurology. We did not start you on medication today, but you will need appropriate follow up.

## 2022-03-30 NOTE — ED Notes (Signed)
Pt to XR

## 2022-03-30 NOTE — ED Provider Notes (Signed)
[facility]-EMERGENCY DEPT Provider Note   CSN: [id number] Arrival date & time: 03/30/22  1447     History  Chief Complaint  Patient presents with   Panic Attack   Loss of Consciousness    Clinton Tucker is a 36 y.o. male.  36 y.o male with a PMH of seizure like activity presents to the ED via EMS status post seizure-like activity versus syncope.  Patient reports he was on his way to work, when suddenly he lost consciousness.  Reports he was out for 2 to 3 minutes according to EMS. Patient does have a history of panic attacks, reports this feels similar in nature. Does report he has been feeling more worked up than usual.  He does have a prior history of seizure-like activity, however is not taking any medication at this time.  He endorses feeling somewhat groggy and fatigued at this time.  Arrived in the ED hypertensive, diaphoretic, alert and oriented x 3. Similar episode in the past where he "had a seizure".   The history is provided by the patient and medical records.  Loss of Consciousness Episode history:  Single Most recent episode:  Today Duration:  3 minutes Timing:  Constant Progression:  Waxing and waning Chronicity:  Recurrent Associated symptoms: seizures   Associated symptoms: no chest pain, no fever, no headaches, no nausea, no shortness of breath and no vomiting        Home Medications Prior to Admission medications   Medication Sig Start Date End Date Taking? Authorizing Provider  doxycycline (VIBRA-TABS) 100 MG tablet Take 1 tablet (100 mg total) by mouth 2 (two) times daily. 07/06/21   Drema Halon, MD  doxycycline (VIBRAMYCIN) 100 MG capsule Take 1 capsule (100 mg total) by mouth 2 (two) times daily. 11/09/19   Eustace Moore, MD      Allergies    Patient has no known allergies.    Review of Systems   Review of Systems  Constitutional:  Positive for fatigue. Negative for chills and fever.  HENT:  Negative for sore  throat.   Respiratory:  Negative for shortness of breath.   Cardiovascular:  Positive for syncope. Negative for chest pain.  Gastrointestinal:  Negative for abdominal pain, nausea and vomiting.  Genitourinary:  Negative for flank pain.  Neurological:  Positive for seizures and syncope. Negative for light-headedness and headaches.  All other systems reviewed and are negative.   Physical Exam Updated Vital Signs BP (!) 132/98   Pulse 65   Temp 97.8 F (36.6 C) (Oral)   Resp 14   Ht 5\' 9"  (1.753 m)   Wt 97.5 kg   SpO2 99%   BMI 31.75 kg/m  Physical Exam Vitals and nursing note reviewed.  Constitutional:      Appearance: Normal appearance.  HENT:     Head: Normocephalic and atraumatic.     Mouth/Throat:     Mouth: Mucous membranes are moist.     Comments: Tongue without any visible signs of injury.  Eyes:     Pupils: Pupils are equal, round, and reactive to light.     Comments: Pupils are equal and reactive.  Cardiovascular:     Rate and Rhythm: Normal rate.  Pulmonary:     Effort: Pulmonary effort is normal.  Abdominal:     General: Abdomen is flat.  Musculoskeletal:     Cervical back: Normal range of motion and neck supple.  Skin:    General: Skin is warm and  dry.  Neurological:     Mental Status: He is alert and oriented to person, place, and time.     Comments: Alert, oriented, thought content appropriate. Speech fluent without evidence of aphasia. Able to follow 2 step commands without difficulty.  Cranial Nerves:  II:  Peripheral visual fields grossly normal, pupils, round, reactive to light III,IV, VI: ptosis not present, extra-ocular motions intact bilaterally  V,VII: smile symmetric, facial light touch sensation equal VIII: hearing grossly normal bilaterally  IX,X: midline uvula rise  XI: bilateral shoulder shrug equal and strong XII: midline tongue extension  Motor:  5/5 in upper and lower extremities bilaterally including strong and equal grip strength  and dorsiflexion/plantar flexion Sensory: light touch normal in all extremities.  Cerebellar: normal finger-to-nose with bilateral upper extremities, pronator drift negative Gait: normal gait and balance       ED Results / Procedures / Treatments   Labs (all labs ordered are listed, but only abnormal results are displayed) Labs Reviewed  BASIC METABOLIC PANEL - Abnormal; Notable for the following components:      Result Value   Glucose, Bld 100 (*)    All other components within normal limits  CBC WITH DIFFERENTIAL/PLATELET  URINALYSIS, ROUTINE W REFLEX MICROSCOPIC  RAPID URINE DRUG SCREEN, HOSP PERFORMED  CBG MONITORING, ED  TROPONIN I (HIGH SENSITIVITY)    EKG EKG Interpretation  Date/Time:  Friday March 30 2022 15:09:54 EDT Ventricular Rate:  85 PR Interval:  165 QRS Duration: 89 QT Interval:  342 QTC Calculation: 407 R Axis:   92 Text Interpretation: Sinus rhythm Confirmed by Virgina Norfolk ([id number]) on 03/30/2022 6:13:02 PM  Radiology CT HEAD WO CONTRAST ( )  Result Date: 03/30/2022 CLINICAL DATA:  Provided history: Delirium. Loss of consciousness. EXAM: CT HEAD WITHOUT CONTRAST TECHNIQUE: Contiguous axial images were obtained from the base of the skull through the vertex without intravenous contrast. RADIATION DOSE REDUCTION: This exam was performed according to the departmental dose-optimization program which includes automated exposure control, adjustment of the mA and/or kV according to patient size and/or use of iterative reconstruction technique. COMPARISON:  Head CT 12/10/2016. FINDINGS: Brain: Cerebral volume is normal. There is no acute intracranial hemorrhage. No demarcated cortical infarct. No extra-axial fluid collection. No evidence of an intracranial mass. No midline shift. Vascular: No hyperdense vessel. Skull: No acute fracture or aggressive osseous lesion. Sinuses/Orbits: No mass or acute finding within the imaged orbits. Mild mucosal thickening within the  left maxillary sinus at the imaged levels. IMPRESSION: No evidence of acute intracranial abnormality. Mild mucosal thickening within the left maxillary sinus at the imaged levels. Electronically Signed   By: Jackey Loge D.O.   On: 03/30/2022 17:04   DG Chest 2 View  Result Date: 03/30/2022 CLINICAL DATA:  Syncope. EXAM: CHEST - 2 VIEW COMPARISON:  None Available. FINDINGS: No consolidation. No visible pleural effusions or pneumothorax. Cardiomediastinal silhouette is within normal limits. No displaced fracture. IMPRESSION: No evidence of acute cardiopulmonary disease. Electronically Signed   By: Feliberto Harts M.D.   On: 03/30/2022 15:24    Procedures Procedures    Medications Ordered in ED Medications  sodium chloride 0.9 % bolus 1,000 mL (1,000 mLs Intravenous New Bag/Given 03/30/22 1641)    ED Course/ Medical Decision Making/ A&P                           Medical Decision Making Amount and/or Complexity of Data Reviewed Labs: ordered. Radiology: ordered. ECG/medicine tests: ordered.  This patient presents to the ED for concern of syncope, this involves a number of treatment options, and is a complaint that carries with it a high risk of complications and morbidity.  The differential diagnosis includes seizure-like activity, panic attack versus syncope.   Co morbidities: Discussed in HPI   Brief History:  Patient here with prior history of seizure-like activity presents to the ED via EMS after driving to work and feeling sudden onset of panic attack, unsure how he arrived here.  Feels somewhat groggy, reports he is under a lot of anxiety.  Prior episodes with panic attack similar to these in nature.  Is currently not on any medication for seizures.    EMR reviewed including pt PMHx, past surgical history and past visits to ER.   See HPI for more details   Lab Tests:  I ordered and independently interpreted labs.  The pertinent results include:    I personally  reviewed all laboratory work and imaging. Metabolic panel without any acute abnormality specifically kidney function within normal limits and no significant electrolyte abnormalities. CBC without leukocytosis or significant anemia.   Imaging Studies:  Chest x-ray without any acute finding.  CT head without any intracranial pathology.    Cardiac Monitoring:  The patient was maintained on a cardiac monitor.  I personally viewed and interpreted the cardiac monitored which showed an underlying rhythm of: NSR 85 EKG non-ischemic   Medicines ordered:  I ordered medication including bolus  for symptomatic treatment Reevaluation of the patient after these medicines showed that the patient improved I have reviewed the patients home medicines and have made adjustments as needed  Reevaluation:  After the interventions noted above I re-evaluated patient and found that they have :resolved   Social Determinants of Health:  The patient's social determinants of health were a factor in the care of this patient    Problem List / ED Course:  Patient presents with seizure-like activity along with loss of consciousness after driving to work.  Prior history of panic attacks, reports this felt similar to this.  He did arrive somewhat diaphoretic, was confused on where exactly he was.  Thought that the month was August.  He reports prior history of seizure-like activity however not currently taking any medication for this.  Denies any drug use.  Labs have been within normal limits.  Troponin is negative. EKG non ischemic, no delta wave, no arrhythmia.  X-ray without any acute finding.  CT head without any acute finding. States that his vital signs were obtained and these were negative.  Given 1 L bolus for symptomatic treatment. Extensive chart review including a review from a visit in 2018 with similar presentation, she was driving to work after receiving bad news also arrived disoriented, was found  with knees up to his chest with the seizure-like activity however sound more to be behavioral etiology.  I reviewed his medications I do not see him being listed any Keppra.  His neuro exam is benign.  He is without any hypoxia, no tachycardia or hypotension.   Dispostion:  After consideration of the diagnostic results and the patients response to treatment, I feel that the patent would benefit from outpatient follow up with neurology, given ambulatory referral. Further management with mental health for better management of panic attacks.     Portions of this note were generated with Scientist, clinical (histocompatibility and immunogenetics). Dictation errors may occur despite best attempts at proofreading.  Final Clinical Impression(s) / ED Diagnoses Final diagnoses:  Seizure-like  activity (HCC)    Rx / DC Orders ED Discharge Orders          Ordered    Ambulatory referral to Neurology       Comments: An appointment is requested in approximately: 1 week   03/30/22 1804              Claude Manges, PA-C 03/30/22 1815    Arby Barrette, MD 04/01/22 1234

## 2023-01-12 ENCOUNTER — Encounter (HOSPITAL_COMMUNITY): Payer: Self-pay

## 2023-01-12 ENCOUNTER — Emergency Department (HOSPITAL_COMMUNITY)
Admission: EM | Admit: 2023-01-12 | Discharge: 2023-01-12 | Discharge status: Against Medical Advice | Payer: BC Managed Care – PPO | Attending: Emergency Medicine | Admitting: Emergency Medicine

## 2023-01-12 DIAGNOSIS — R079 Chest pain, unspecified: Secondary | ICD-10-CM | POA: Diagnosis not present

## 2023-01-12 DIAGNOSIS — Z5321 Procedure and treatment not carried out due to patient leaving prior to being seen by health care provider: Secondary | ICD-10-CM | POA: Insufficient documentation

## 2023-01-12 LAB — RAPID URINE DRUG SCREEN, HOSP PERFORMED
Amphetamines: POSITIVE — AB
Barbiturates: NOT DETECTED
Benzodiazepines: NOT DETECTED
Cocaine: NOT DETECTED
Opiates: NOT DETECTED
Tetrahydrocannabinol: NOT DETECTED

## 2023-01-12 LAB — CBC WITH DIFFERENTIAL/PLATELET
Abs Immature Granulocytes: 0.02 10*3/uL (ref 0.00–0.07)
Basophils Absolute: 0 10*3/uL (ref 0.0–0.1)
Basophils Relative: 0 %
Eosinophils Absolute: 0 10*3/uL (ref 0.0–0.5)
Eosinophils Relative: 0 %
HCT: 47.4 % (ref 39.0–52.0)
Hemoglobin: 15.5 g/dL (ref 13.0–17.0)
Immature Granulocytes: 0 %
Lymphocytes Relative: 14 %
Lymphs Abs: 1.2 10*3/uL (ref 0.7–4.0)
MCH: 27.7 pg (ref 26.0–34.0)
MCHC: 32.7 g/dL (ref 30.0–36.0)
MCV: 84.6 fL (ref 80.0–100.0)
Monocytes Absolute: 0.5 10*3/uL (ref 0.1–1.0)
Monocytes Relative: 6 %
Neutro Abs: 6.7 10*3/uL (ref 1.7–7.7)
Neutrophils Relative %: 80 %
Platelets: 261 10*3/uL (ref 150–400)
RBC: 5.6 MIL/uL (ref 4.22–5.81)
RDW: 12.7 % (ref 11.5–15.5)
WBC: 8.4 10*3/uL (ref 4.0–10.5)
nRBC: 0 % (ref 0.0–0.2)

## 2023-01-12 LAB — COMPREHENSIVE METABOLIC PANEL
ALT: 72 U/L — ABNORMAL HIGH (ref 0–44)
AST: 53 U/L — ABNORMAL HIGH (ref 15–41)
Albumin: 4.2 g/dL (ref 3.5–5.0)
Alkaline Phosphatase: 107 U/L (ref 38–126)
Anion gap: 11 (ref 5–15)
BUN: 12 mg/dL (ref 6–20)
CO2: 24 mmol/L (ref 22–32)
Calcium: 9.8 mg/dL (ref 8.9–10.3)
Chloride: 100 mmol/L (ref 98–111)
Creatinine, Ser: 1.06 mg/dL (ref 0.61–1.24)
GFR, Estimated: >60 mL/min (ref >60)
Glucose, Bld: 134 mg/dL — ABNORMAL HIGH (ref 70–99)
Potassium: 4 mmol/L (ref 3.5–5.1)
Sodium: 135 mmol/L (ref 135–145)
Total Bilirubin: 0.9 mg/dL (ref 0.3–1.2)
Total Protein: 7.7 g/dL (ref 6.5–8.1)

## 2023-01-12 LAB — T4, FREE: Free T4: 0.94 ng/dL (ref 0.61–1.12)

## 2023-01-12 LAB — MAGNESIUM: Magnesium: 2.1 mg/dL (ref 1.7–2.4)

## 2023-01-12 LAB — TSH: TSH: 0.275 u[IU]/mL — ABNORMAL LOW (ref 0.350–4.500)

## 2023-01-12 MED ORDER — SODIUM CHLORIDE 0.9 % IV BOLUS: 1000.0000 mL | Freq: Once | INTRAVENOUS | Status: DC

## 2023-01-12 MED ORDER — ACETAMINOPHEN 325 MG PO TABS
650.0000 mg | ORAL_TABLET | Freq: Once | ORAL | Status: AC
  Administered 2023-01-12: 650 mg via ORAL
  Filled 2023-01-12: qty 2

## 2023-01-12 NOTE — ED Notes (Signed)
Patient left on own accord °

## 2023-01-12 NOTE — ED Triage Notes (Signed)
Pt states that he has been having CP since 4pm, pain is sharp, denies any other cardiac symptoms

## 2023-08-23 ENCOUNTER — Emergency Department (HOSPITAL_COMMUNITY): Payer: BC Managed Care – PPO

## 2023-08-23 ENCOUNTER — Emergency Department (HOSPITAL_COMMUNITY)
Admission: EM | Admit: 2023-08-23 | Discharge: 2023-08-23 | Disposition: A | Discharge status: Home / Self Care | Payer: BC Managed Care – PPO | Attending: Emergency Medicine | Admitting: Emergency Medicine

## 2023-08-23 ENCOUNTER — Other Ambulatory Visit: Payer: Self-pay

## 2023-08-23 DIAGNOSIS — E162 Hypoglycemia, unspecified: Secondary | ICD-10-CM | POA: Insufficient documentation

## 2023-08-23 DIAGNOSIS — R55 Syncope and collapse: Secondary | ICD-10-CM | POA: Insufficient documentation

## 2023-08-23 DIAGNOSIS — Z1152 Encounter for screening for COVID-19: Secondary | ICD-10-CM | POA: Diagnosis not present

## 2023-08-23 DIAGNOSIS — R4020 Unspecified coma: Secondary | ICD-10-CM

## 2023-08-23 DIAGNOSIS — J101 Influenza due to other identified influenza virus with other respiratory manifestations: Secondary | ICD-10-CM | POA: Insufficient documentation

## 2023-08-23 LAB — RAPID URINE DRUG SCREEN, HOSP PERFORMED
Amphetamines: POSITIVE — AB
Barbiturates: NOT DETECTED
Benzodiazepines: NOT DETECTED
Cocaine: NOT DETECTED
Opiates: NOT DETECTED
Tetrahydrocannabinol: NOT DETECTED

## 2023-08-23 LAB — RESP PANEL BY RT-PCR (RSV, FLU A&B, COVID)  RVPGX2
Influenza A by PCR: POSITIVE — AB
Influenza B by PCR: NEGATIVE
Resp Syncytial Virus by PCR: NEGATIVE
SARS Coronavirus 2 by RT PCR: NEGATIVE

## 2023-08-23 LAB — COMPREHENSIVE METABOLIC PANEL
ALT: 46 U/L — ABNORMAL HIGH (ref 0–44)
AST: 54 U/L — ABNORMAL HIGH (ref 15–41)
Albumin: 3.5 g/dL (ref 3.5–5.0)
Alkaline Phosphatase: 102 U/L (ref 38–126)
Anion gap: 6 (ref 5–15)
BUN: 7 mg/dL (ref 6–20)
CO2: 26 mmol/L (ref 22–32)
Calcium: 8.3 mg/dL — ABNORMAL LOW (ref 8.9–10.3)
Chloride: 105 mmol/L (ref 98–111)
Creatinine, Ser: 1.08 mg/dL (ref 0.61–1.24)
GFR, Estimated: >60 mL/min (ref >60)
Glucose, Bld: 92 mg/dL (ref 70–99)
Potassium: 3.4 mmol/L — ABNORMAL LOW (ref 3.5–5.1)
Sodium: 137 mmol/L (ref 135–145)
Total Bilirubin: 1.1 mg/dL (ref <1.2)
Total Protein: 6.5 g/dL (ref 6.5–8.1)

## 2023-08-23 LAB — URINALYSIS, W/ REFLEX TO CULTURE (INFECTION SUSPECTED)
Bacteria, UA: NONE SEEN
Bilirubin Urine: NEGATIVE
Glucose, UA: NEGATIVE mg/dL
Hgb urine dipstick: NEGATIVE
Ketones, ur: NEGATIVE mg/dL
Leukocytes,Ua: NEGATIVE
Nitrite: NEGATIVE
Protein, ur: 30 mg/dL — AB
Specific Gravity, Urine: 1.029 (ref 1.005–1.030)
pH: 5 (ref 5.0–8.0)

## 2023-08-23 LAB — CBC
HCT: 43.3 % (ref 39.0–52.0)
Hemoglobin: 14.3 g/dL (ref 13.0–17.0)
MCH: 28.2 pg (ref 26.0–34.0)
MCHC: 33 g/dL (ref 30.0–36.0)
MCV: 85.4 fL (ref 80.0–100.0)
Platelets: 196 10*3/uL (ref 150–400)
RBC: 5.07 MIL/uL (ref 4.22–5.81)
RDW: 13.2 % (ref 11.5–15.5)
WBC: 5.3 10*3/uL (ref 4.0–10.5)
nRBC: 0 % (ref 0.0–0.2)

## 2023-08-23 LAB — LACTIC ACID, PLASMA: Lactic Acid, Venous: 1.3 mmol/L (ref 0.5–1.9)

## 2023-08-23 LAB — CBG MONITORING, ED
Glucose-Capillary: 66 mg/dL — ABNORMAL LOW (ref 70–99)
Glucose-Capillary: 75 mg/dL (ref 70–99)

## 2023-08-23 MED ORDER — ONDANSETRON HCL 4 MG/2ML IJ SOLN
4.0000 mg | Freq: Once | INTRAMUSCULAR | Status: AC
  Administered 2023-08-23: 4 mg via INTRAVENOUS
  Filled 2023-08-23: qty 2

## 2023-08-23 MED ORDER — POTASSIUM CHLORIDE 20 MEQ PO PACK
40.0000 meq | PACK | Freq: Once | ORAL | Status: AC
  Administered 2023-08-23: 40 meq via ORAL
  Filled 2023-08-23: qty 2

## 2023-08-23 MED ORDER — SODIUM CHLORIDE 0.9 % IV BOLUS
1000.0000 mL | Freq: Once | INTRAVENOUS | Status: AC
  Administered 2023-08-23: 1000 mL via INTRAVENOUS

## 2023-08-23 NOTE — ED Triage Notes (Addendum)
Pt BIB [facility] for syncopal episode at work with LOC Pale & Diaphoretic. Fever since Wednesday w/ cold symptoms. 80/42 bradycardic 118 CBG 1000 NS 18G left AC  last BP 112/81

## 2023-08-23 NOTE — Discharge Instructions (Addendum)
Your liver enzymes were elevated on your blood work today.  This could be because of dehydration.  Please ensure that you are drinking plenty of fluids.  You should follow-up on this with your primary care doctor within the next 1 week to have your blood work repeated.  He tested positive for the flu today.  It is very important you stay hydrated.  Please ensure drinking plenty of fluids, such as water, sports drinks, juices.  Stay away from caffeinated drinks such as coffee, tea, or sodas.  Sure that you are eating regular meals.  Please return to the emergency department if you develop chest pain, shortness of breath, repeated episodes of passing out.

## 2023-08-23 NOTE — ED Provider Notes (Signed)
  Physical Exam  BP 101/78   Pulse 78   Temp 98.1 F (36.7 C) (Oral)   Resp (!) 23   SpO2 99%   Physical Exam Vitals reviewed.  Constitutional:      General: He is not in acute distress.    Appearance: He is normal weight. He is ill-appearing. He is not toxic-appearing or diaphoretic.  Neurological:     Mental Status: He is alert.     GCS: GCS eye subscore is 4. GCS verbal subscore is 5. GCS motor subscore is 6.     Gait: Gait is intact. Gait normal.     Procedures  Procedures  ED Course / MDM   Clinical Course as of 08/23/23 1722  Fri Aug 23, 2023  1501 LOC at work No postictal period Was pale, diaphoretic, hypotensive in route Got bolus with EMS Fever + URI sx for past 1 week F/u pending labs, imaging [JR]  1517 EKG 12-Lead Sinus rhythm.  Rate of 87.  Normal intervals.  No axis deviation.  Biphasic T wave in lead III.  T-segment changes.  Nonischemic ECG. [JR]    Clinical Course User Index [JR] Rolla Flatten, MD   Medical Decision Making Amount and/or Complexity of Data Reviewed Labs: ordered. Radiology: ordered. ECG/medicine tests:  Decision-making details documented in ED Course.  Risk Prescription drug management.   I independently reviewed patient's workup, including ECG, imaging, and labs.  Workup notable for mild transaminitis and viral testing positive for influenza A.  Suspect transaminitis is related to acute viral illness.  CT head and chest x-ray unremarkable.  ECG is nonischemic.  Workup here overall reassuring.  Suspect patient's syncopal episode was likely vasovagal in nature related to his acute illness and potentially contributing dehydration.  Patient has been able to ambulate here unassisted.  He was rehydrated with IV fluids here.  Feel that he is appropriate for outpatient management.  Discussed return precautions with the patient.  Patient was discharged in stable condition.       Rolla Flatten, MD 08/23/23 1724    Glyn Ade,  MD 08/23/23 1900

## 2023-08-23 NOTE — ED Provider Notes (Signed)
[place] EMERGENCY DEPARTMENT AT [facility] Provider Note   CSN: [id number] Arrival date & time: 08/23/23  1212     History  Chief Complaint  Patient presents with   Loss of Consciousness   HPI Clinton Tucker is a 37 y.o. male presenting for loss of consciousness.  States he was at work couple of hours ago when according to witnesses he "passed out".  Unsure if he hit his head.  He was witnessed to be pale and diaphoretic at that time.  Endorses fever since Wednesday along with the cold systems.  Was found to be hypotensive and route by EMS, treated with 1 L bolus of normal saline and blood pressure normalized.  At this time denies chest pain and shortness of breath.  States that he is very "tired".  Denies alcohol or illicit drug use.  Has known history of seizures.   Loss of Consciousness      Home Medications Prior to Admission medications   Medication Sig Start Date End Date Taking? Authorizing Provider  amphetamine-dextroamphetamine (ADDERALL) 20 MG tablet Take 20 mg by mouth 2 (two) times daily.   Yes [provider]  pantoprazole (PROTONIX) 40 MG tablet Take 40 mg by mouth daily. 07/04/23  Yes [provider]  Pseudoeph-Doxylamine-DM-APAP (NYQUIL PO) Take 30 mLs by mouth See admin instructions. Take 30 ml by mouth every 4 hours as needed for cold through the night   Yes [provider]  Pseudoephedrine-APAP-DM (DAYQUIL PO) Take 30 mLs by mouth every 4 (four) hours as needed (cold). Taken during the day   Yes [provider]      Allergies    Patient has no known allergies.    Review of Systems   Review of Systems  Cardiovascular:  Positive for syncope.    Physical Exam   Vitals:   08/23/23 1224 08/23/23 1400  BP: (!) 122/95 101/78  Pulse: 78 78  Resp: 18 (!) 23  Temp: 98.1 F (36.7 C)   SpO2: 100% 99%    CONSTITUTIONAL:  well-appearing, NAD, drowsy  NEURO:  GCS 15. Speech is goal oriented. No deficits  appreciated to CN III-XII; symmetric eyebrow raise, no facial drooping, tongue midline. Patient has equal grip strength bilaterally with 5/5 strength against resistance in all major muscle groups bilaterally. Sensation to light touch intact. Patient moves extremities without ataxia. Normal finger-nose-finger.  EYES:  eyes equal and reactive ENT/NECK:  Supple, no stridor, no tongue biting CARDIO:  regular rate and rhythm, appears well-perfused  PULM:  No respiratory distress, CTAB GI/GU:  non-distended, soft, non tender MSK/SPINE:  No gross deformities, no edema, moves all extremities  SKIN:  no rash, atraumatic   *Additional and/or pertinent findings included in MDM below    ED Results / Procedures / Treatments   Labs (all labs ordered are listed, but only abnormal results are displayed) Labs Reviewed  COMPREHENSIVE METABOLIC PANEL - Abnormal; Notable for the following components:      Result Value   Potassium 3.4 (*)    Calcium 8.3 (*)    AST 54 (*)    ALT 46 (*)    All other components within normal limits  URINALYSIS, W/ REFLEX TO CULTURE (INFECTION SUSPECTED) - Abnormal; Notable for the following components:   Color, Urine AMBER (*)    APPearance HAZY (*)    Protein, ur 30 (*)    All other components within normal limits  CBG MONITORING, ED - Abnormal; Notable for the following components:  Glucose-Capillary 66 (*)    All other components within normal limits  RESP PANEL BY RT-PCR (RSV, FLU A&B, COVID)  [id number]  CBC  LACTIC ACID, PLASMA  LACTIC ACID, PLASMA  RAPID URINE DRUG SCREEN, HOSP PERFORMED  CBG MONITORING, ED    EKG EKG Interpretation Date/Time:  Friday August 23 2023 13:19:19 EST Ventricular Rate:  87 PR Interval:  159 QRS Duration:  93 QT Interval:  348 QTC Calculation: 419 R Axis:   95  Text Interpretation: Sinus rhythm Borderline right axis deviation similar to previous Confirmed by Coralee Pesa [phone]) on 08/23/2023 1:45:57 PM  Radiology DG  Chest Portable 1 View Result Date: 08/23/2023 CLINICAL DATA:  Syncope associated with pallor, diaphoresis, and fever EXAM: PORTABLE CHEST 1 VIEW COMPARISON:  Chest radiograph dated 03/30/2022 FINDINGS: Normal lung volumes. No focal consolidations. No pleural effusion or pneumothorax. The heart size and mediastinal contours are within normal limits. No acute osseous abnormality. IMPRESSION: No active disease. Electronically Signed   By: Agustin Cree M.D.   On: 08/23/2023 15:02    Procedures Procedures    Medications Ordered in ED Medications  ondansetron (ZOFRAN) injection 4 mg (has no administration in time range)  potassium chloride (KLOR-CON) packet 40 mEq (has no administration in time range)  sodium chloride 0.9 % bolus 1,000 mL (1,000 mLs Intravenous New Bag/Given 08/23/23 1443)    ED Course/ Medical Decision Making/ A&P Clinical Course as of 08/23/23 1526  Fri Aug 23, 2023  1501 LOC at work No postictal period Was pale, diaphoretic, hypotensive in route Got bolus with EMS Fever + URI sx for past 1 week F/u pending labs, imaging [JR]  1517 EKG 12-Lead Sinus rhythm.  Rate of 87.  Normal intervals.  No axis deviation.  Biphasic T wave in lead III.  T-segment changes.  Nonischemic ECG. [JR]    Clinical Course User Index [JR] Rolla Flatten, MD                                 Medical Decision Making Amount and/or Complexity of Data Reviewed Labs: ordered. Radiology: ordered. ECG/medicine tests:  Decision-making details documented in ED Course.  Risk Prescription drug management.  Initial Impression and Ddx 37 year old well-appearing male presenting for syncope.  Exam was unremarkable.  DDx includes seizure, cardiogenic syncope, dehydration, orthostasis, stroke, vasovagal syncope, other. Patient PMH that increases complexity of ED encounter:  none  Interpretation of Diagnostics - I independent reviewed and interpreted the labs as followed: hypoglycemia, hypoglycemia  - I  independently visualized the following imaging with scope of interpretation limited to determining acute life threatening conditions related to emergency care: cxr, which revealed no acute findings.  He had is pending  -I personally reviewed interpret EKG which revealed sinus rhythm and otherwise nonischemic  Patient Reassessment and Ultimate Disposition/Management On reassessment, blood sugar after patient drank some juice.  Orthostatic blood pressures were unremarkable.  Doubt stroke given no focal neurodeficits but CT head is still pending.  Doubt seizure given no witnessed postictal phase and lack of exam findings.  Patient reports cough congestion and subjective fever at home, suspect syncope could be related to viral process and possible dehydration.  Workup overall does not suggest sepsis.  Plan for now is to follow-up on head CT, volume resuscitate, administer Zofran and reassess.  If patient is feeling better and remaining workup is reassuring can be discharged with supportive treatment for what is likely a viral process  and follow-up with PCP.  Patient management required discussion with the following services or consulting groups:  None  Complexity of Problems Addressed Acute complicated illness or Injury  Additional Data Reviewed and Analyzed Further history obtained from: Past medical history and medications listed in the EMR and Prior ED visit notes  Patient Encounter Risk Assessment None          Final Clinical Impression(s) / ED Diagnoses Final diagnoses:  Loss of consciousness [facility])    Rx / DC Orders ED Discharge Orders     None         Gareth Eagle, PA-C 08/23/23 1526    Horton, Clabe Seal, DO 08/25/23 1426

## 2024-04-15 ENCOUNTER — Encounter (HOSPITAL_COMMUNITY): Payer: Self-pay | Admitting: Student in an Organized Health Care Education/Training Program

## 2024-04-15 ENCOUNTER — Other Ambulatory Visit: Payer: Self-pay

## 2024-04-15 ENCOUNTER — Ambulatory Visit (HOSPITAL_COMMUNITY)
Admission: EM | Admit: 2024-04-15 | Discharge: 2024-04-15 | Disposition: A | Discharge status: Other Acute Inpatient Hospital | Attending: Nurse Practitioner | Admitting: Nurse Practitioner

## 2024-04-15 ENCOUNTER — Inpatient Hospital Stay (HOSPITAL_COMMUNITY)
Admission: AD | Admit: 2024-04-15 | Discharge: 2024-04-19 | DRG: 885 | Disposition: A | Discharge status: Home / Self Care | Source: Intra-hospital | Attending: Psychiatry | Admitting: Psychiatry

## 2024-04-15 DIAGNOSIS — Z8249 Family history of ischemic heart disease and other diseases of the circulatory system: Secondary | ICD-10-CM

## 2024-04-15 DIAGNOSIS — Z9151 Personal history of suicidal behavior: Secondary | ICD-10-CM | POA: Insufficient documentation

## 2024-04-15 DIAGNOSIS — Z5986 Financial insecurity: Secondary | ICD-10-CM | POA: Diagnosis not present

## 2024-04-15 DIAGNOSIS — F429 Obsessive-compulsive disorder, unspecified: Principal | ICD-10-CM | POA: Diagnosis present

## 2024-04-15 DIAGNOSIS — R45851 Suicidal ideations: Secondary | ICD-10-CM | POA: Diagnosis present

## 2024-04-15 DIAGNOSIS — F410 Panic disorder [episodic paroxysmal anxiety] without agoraphobia: Secondary | ICD-10-CM | POA: Diagnosis present

## 2024-04-15 DIAGNOSIS — Z5948 Other specified lack of adequate food: Secondary | ICD-10-CM | POA: Diagnosis not present

## 2024-04-15 DIAGNOSIS — Z833 Family history of diabetes mellitus: Secondary | ICD-10-CM | POA: Diagnosis not present

## 2024-04-15 DIAGNOSIS — G4700 Insomnia, unspecified: Secondary | ICD-10-CM | POA: Diagnosis present

## 2024-04-15 DIAGNOSIS — Z5941 Food insecurity: Secondary | ICD-10-CM | POA: Diagnosis not present

## 2024-04-15 DIAGNOSIS — F332 Major depressive disorder, recurrent severe without psychotic features: Principal | ICD-10-CM | POA: Diagnosis present

## 2024-04-15 DIAGNOSIS — R440 Auditory hallucinations: Secondary | ICD-10-CM | POA: Diagnosis not present

## 2024-04-15 DIAGNOSIS — F3181 Bipolar II disorder: Secondary | ICD-10-CM | POA: Insufficient documentation

## 2024-04-15 DIAGNOSIS — Z79899 Other long term (current) drug therapy: Secondary | ICD-10-CM | POA: Diagnosis not present

## 2024-04-15 DIAGNOSIS — F411 Generalized anxiety disorder: Secondary | ICD-10-CM | POA: Insufficient documentation

## 2024-04-15 DIAGNOSIS — Z1152 Encounter for screening for COVID-19: Secondary | ICD-10-CM

## 2024-04-15 LAB — COMPREHENSIVE METABOLIC PANEL WITH GFR
ALT: 44 U/L (ref 0–44)
AST: 33 U/L (ref 15–41)
Albumin: 4.2 g/dL (ref 3.5–5.0)
Alkaline Phosphatase: 64 U/L (ref 38–126)
Anion gap: 12 (ref 5–15)
BUN: 9 mg/dL (ref 6–20)
CO2: 22 mmol/L (ref 22–32)
Calcium: 9.7 mg/dL (ref 8.9–10.3)
Chloride: 104 mmol/L (ref 98–111)
Creatinine, Ser: 0.81 mg/dL (ref 0.61–1.24)
GFR, Estimated: >60 mL/min (ref >60)
Glucose, Bld: 82 mg/dL (ref 70–99)
Potassium: 4.1 mmol/L (ref 3.5–5.1)
Sodium: 138 mmol/L (ref 135–145)
Total Bilirubin: 1.2 mg/dL (ref 0.0–1.2)
Total Protein: 7.2 g/dL (ref 6.5–8.1)

## 2024-04-15 LAB — CBC WITH DIFFERENTIAL/PLATELET
Abs Immature Granulocytes: 0.03 K/uL (ref 0.00–0.07)
Basophils Absolute: 0 K/uL (ref 0.0–0.1)
Basophils Relative: 1 %
Eosinophils Absolute: 0.1 K/uL (ref 0.0–0.5)
Eosinophils Relative: 1 %
HCT: 45.7 % (ref 39.0–52.0)
Hemoglobin: 15.3 g/dL (ref 13.0–17.0)
Immature Granulocytes: 0 %
Lymphocytes Relative: 15 %
Lymphs Abs: 1.2 K/uL (ref 0.7–4.0)
MCH: 27.8 pg (ref 26.0–34.0)
MCHC: 33.5 g/dL (ref 30.0–36.0)
MCV: 82.9 fL (ref 80.0–100.0)
Monocytes Absolute: 0.7 K/uL (ref 0.1–1.0)
Monocytes Relative: 8 %
Neutro Abs: 6.2 K/uL (ref 1.7–7.7)
Neutrophils Relative %: 75 %
Platelets: 249 K/uL (ref 150–400)
RBC: 5.51 MIL/uL (ref 4.22–5.81)
RDW: 12.8 % (ref 11.5–15.5)
WBC: 8.2 K/uL (ref 4.0–10.5)
nRBC: 0 % (ref 0.0–0.2)

## 2024-04-15 LAB — TSH: TSH: 0.985 u[IU]/mL (ref 0.350–4.500)

## 2024-04-15 LAB — POCT URINE DRUG SCREEN - MANUAL ENTRY (I-SCREEN)
POC Amphetamine UR: NOT DETECTED
POC Buprenorphine (BUP): NOT DETECTED
POC Cocaine UR: NOT DETECTED
POC Marijuana UR: NOT DETECTED
POC Methadone UR: NOT DETECTED
POC Methamphetamine UR: NOT DETECTED
POC Morphine: NOT DETECTED
POC Oxazepam (BZO): NOT DETECTED
POC Oxycodone UR: NOT DETECTED
POC Secobarbital (BAR): NOT DETECTED

## 2024-04-15 LAB — URINALYSIS, ROUTINE W REFLEX MICROSCOPIC
Bilirubin Urine: NEGATIVE
Glucose, UA: NEGATIVE mg/dL
Hgb urine dipstick: NEGATIVE
Ketones, ur: NEGATIVE mg/dL
Leukocytes,Ua: NEGATIVE
Nitrite: NEGATIVE
Protein, ur: NEGATIVE mg/dL
Specific Gravity, Urine: 1.016 (ref 1.005–1.030)
pH: 6 (ref 5.0–8.0)

## 2024-04-15 LAB — LIPID PANEL
Cholesterol: 213 mg/dL — ABNORMAL HIGH (ref 0–200)
HDL: 59 mg/dL (ref >40)
LDL Cholesterol: 133 mg/dL — ABNORMAL HIGH (ref 0–99)
Total CHOL/HDL Ratio: 3.6 ratio
Triglycerides: 105 mg/dL (ref <150)
VLDL: 21 mg/dL (ref 0–40)

## 2024-04-15 LAB — VITAMIN D 25 HYDROXY (VIT D DEFICIENCY, FRACTURES): Vit D, 25-Hydroxy: 12.58 ng/mL — ABNORMAL LOW (ref 30–100)

## 2024-04-15 LAB — ETHANOL: Alcohol, Ethyl (B): <15 mg/dL (ref <15)

## 2024-04-15 LAB — VITAMIN B12: Vitamin B-12: 234 pg/mL (ref 180–914)

## 2024-04-15 LAB — MAGNESIUM: Magnesium: 1.9 mg/dL (ref 1.7–2.4)

## 2024-04-15 MED ORDER — DIPHENHYDRAMINE HCL 50 MG/ML IJ SOLN: 50.0000 mg | Freq: Three times a day (TID) | INTRAMUSCULAR | Status: DC | PRN

## 2024-04-15 MED ORDER — HALOPERIDOL LACTATE 5 MG/ML IJ SOLN: 10.0000 mg | Freq: Three times a day (TID) | INTRAMUSCULAR | Status: DC | PRN

## 2024-04-15 MED ORDER — LORAZEPAM 2 MG/ML IJ SOLN: 2.0000 mg | Freq: Three times a day (TID) | INTRAMUSCULAR | Status: DC | PRN

## 2024-04-15 MED ORDER — HALOPERIDOL LACTATE 5 MG/ML IJ SOLN: 5.0000 mg | Freq: Three times a day (TID) | INTRAMUSCULAR | Status: DC | PRN

## 2024-04-15 MED ORDER — HYDROXYZINE HCL 25 MG PO TABS
25.0000 mg | ORAL_TABLET | Freq: Three times a day (TID) | ORAL | Status: DC | PRN
  Administered 2024-04-15 – 2024-04-18 (×4): 25 mg via ORAL
  Filled 2024-04-15 (×4): qty 1

## 2024-04-15 MED ORDER — DIPHENHYDRAMINE HCL 25 MG PO CAPS: 50.0000 mg | ORAL_CAPSULE | Freq: Three times a day (TID) | ORAL | Status: DC | PRN

## 2024-04-15 MED ORDER — ACETAMINOPHEN 325 MG PO TABS: 650.0000 mg | ORAL_TABLET | Freq: Four times a day (QID) | ORAL | Status: DC | PRN

## 2024-04-15 MED ORDER — FLUVOXAMINE MALEATE 50 MG PO TABS: 25.0000 mg | ORAL_TABLET | Freq: Every day | ORAL | Status: DC

## 2024-04-15 MED ORDER — ALUM & MAG HYDROXIDE-SIMETH 200-200-20 MG/5ML PO SUSP: 30.0000 mL | ORAL | Status: DC | PRN

## 2024-04-15 MED ORDER — HALOPERIDOL 5 MG PO TABS: 5.0000 mg | ORAL_TABLET | Freq: Three times a day (TID) | ORAL | Status: DC | PRN

## 2024-04-15 MED ORDER — HYDROXYZINE HCL 25 MG PO TABS: 25.0000 mg | ORAL_TABLET | Freq: Three times a day (TID) | ORAL | Status: DC | PRN

## 2024-04-15 MED ORDER — DIPHENHYDRAMINE HCL 50 MG PO CAPS: 50.0000 mg | ORAL_CAPSULE | Freq: Three times a day (TID) | ORAL | Status: DC | PRN

## 2024-04-15 MED ORDER — MAGNESIUM HYDROXIDE 400 MG/5ML PO SUSP
30.0000 mL | Freq: Every day | ORAL | Status: DC | PRN
Start: 2024-04-15 — End: 2024-04-15

## 2024-04-15 MED ORDER — MAGNESIUM HYDROXIDE 400 MG/5ML PO SUSP: 30.0000 mL | Freq: Every day | ORAL | Status: DC | PRN

## 2024-04-15 MED ORDER — TRAZODONE HCL 50 MG PO TABS
50.0000 mg | ORAL_TABLET | Freq: Every evening | ORAL | Status: DC | PRN
  Administered 2024-04-18: 50 mg via ORAL
  Filled 2024-04-15: qty 1

## 2024-04-15 MED ORDER — TRAZODONE HCL 50 MG PO TABS: 50.0000 mg | ORAL_TABLET | Freq: Every evening | ORAL | Status: DC | PRN

## 2024-04-15 MED ORDER — ALUM & MAG HYDROXIDE-SIMETH 200-200-20 MG/5ML PO SUSP
30.0000 mL | ORAL | Status: DC | PRN
Start: 2024-04-15 — End: 2024-04-15

## 2024-04-15 MED ORDER — HYDROXYZINE HCL 25 MG PO TABS
50.0000 mg | ORAL_TABLET | Freq: Once | ORAL | Status: AC
  Administered 2024-04-15: 50 mg via ORAL
  Filled 2024-04-15: qty 2

## 2024-04-15 MED ORDER — FLUVOXAMINE MALEATE 50 MG PO TABS
25.0000 mg | ORAL_TABLET | Freq: Every day | ORAL | Status: DC
  Administered 2024-04-15 – 2024-04-17 (×3): 25 mg via ORAL
  Filled 2024-04-15 (×3): qty 1

## 2024-04-15 NOTE — ED Notes (Signed)
 Pt admitted voluntarily to observation unit endorsing SI w/plan to OD on medication due to financial stressors. Pt sad, tearful during interview. Pt states, I'm drowning in bills and I can't take it. Past hx include suicide attempt. Calm, cooperative throughout interview process. Skin assessment completed. Oriented to unit. Meal and drink offered. At currrent, pt continue to endorse SI. Pt verbally contract for safety only while hospitalized. Will monitor for safety.

## 2024-04-15 NOTE — Progress Notes (Signed)
 Psychoeducational Group Note  Date:  04/15/2024 Time:  2120  Group Topic/Focus:  Recovery Goals:   The focus of this group is to identify appropriate goals for recovery and establish a plan to achieve them.  Participation Level: Did Not Attend  Participation Quality:  Not Applicable  Affect:  Not Applicable  Cognitive:  Not Applicable  Insight:  Not Applicable  Engagement in Group: Not Applicable  Additional Comments:  The patient did not attend group this evening.   Herby Amick S 04/15/2024, 9:20 PM

## 2024-04-15 NOTE — Tx Team (Signed)
 Initial Treatment Plan 04/15/2024 6:44 PM RIVEN MABILE FMW:[id number]    PATIENT STRESSORS: Financial difficulties   Marital or family conflict     PATIENT STRENGTHS: Communication skills    PATIENT IDENTIFIED PROBLEMS:                      DISCHARGE CRITERIA:  Verbal commitment to aftercare and medication compliance  PRELIMINARY DISCHARGE PLAN: Outpatient therapy  PATIENT/FAMILY INVOLVEMENT: This treatment plan has been presented to and reviewed with the patient, Clinton Tucker, and/or family member.  The patient and family have been given the opportunity to ask questions and make suggestions.  Inocente JINNY Cassette, RN 04/15/2024, 6:44 PM

## 2024-04-15 NOTE — Progress Notes (Signed)
 [facility] Admission Note:  Patient is a 38 year old male admitted voluntarily for worsening depression and SI with a plan to overdose on medications. Patient reports recent financial stress and aging parents as triggers for current SI. Patient reports that 10 years ago he attempted suicide by hanging. Patient endorses passive SI, denies HI and AVH and contracts for safety on the unit. Consents were signed and orientation packet was reviewed with patient. Falls risk was assessed as low and patient verbalized understanding of fall prevention. Skin was assessed with Rheebe, RN and noted were surgical scar on left abdomen, scars from insect bites on legs. Belongings were searched and placed in locker #29. Patient was provided meal and beverage and oriented to the unit. Safety checks were initiated at 15 minute intervals.

## 2024-04-15 NOTE — ED Provider Notes (Cosign Needed Addendum)
 [facility] Continuous Assessment Admission H&P  Date: 04/15/24 Patient Name: Clinton Tucker MRN: [id number] Chief Complaint: worsening depressive symptoms with SI & plan  Diagnoses:  Final diagnoses:  Obsessive-compulsive disorder, unspecified type  MDD (major depressive disorder), recurrent severe, without psychosis (HCC)  GAD (generalized anxiety disorder)   HPI: Clinton Tucker is a 38 y.o. male with a reported prior mental health history of bipolar 2 d/o who presented to the [facility] this day with complaints of worsening depressive symptoms & SI with a plan to overdose on OTC meds.   Assessment & Review of Psychiatry Symptoms: On assessment, patient reports that he called the suicide hotline number today, due to worsening intrusive thoughts of suicide.  He shares that he has been depressed since he was in elementary school.  Recalls being depressed since age 69 years old.  Reports that he was last on meds >10 yrs ago for bipolar 2 d/o, shares that he was on Lamictal as well as another medication which he is unable to recall name. States that medication led to so much weight gain that he was forced. to stop them, and has not been on any others.  Patient describes symptoms that are more significant for OCD & MDD, rather than bipolar d/o; he describes persistent, and recurrent intrusive thoughts to get football paraphernalia, is able to recognize that these thoughts are irrational, and that he should not be indulging in them, yet, he is unable to stop himself from compulsively & impulsively purchasing these items. He reports that the compulsive shopping for paraphernalia has been what has been elevating his mood,  but it has been most recently, causing him financial difficulties. He shares that he woke up today morning to find out that his bank account was overdrafted by $200, but caused his suicidal thoughts to worsen, rendering him to call the suicide hotline number, leading to the presentation at  this location.  Patient reports insomnia, sleeping for 4 to 5 hours nightly, anhedonia, states only thing that makes him happy with the compulsive shopping, but he cannot do that anymore due to financial issues.  He reports feelings of guilt regarding his excessive spending.  Reports decreased energy levels, poor concentration levels, decreased appetite, describes symptoms consistent with psychomotor retardation; difficulty getting his mind coordinated with his physical body to do things that positively impact him.  He reports worsening mental clouding, as well as intrusive suicidal thoughts.  Reports that the symptoms above have been going on for at least the past year, worsening in the past few months.  Patient describes symptoms significant for GAD and panic  disorder; reports restlessness, excessive worrying, as well as muscle tension.  Reports situations where he feels like he cannot breath, where his mind is in a fight or flight mode, reports having panic attacks while driving at times, rendering him to pull car by the side of the road.  Patient reports auditory hallucinations, but on further assessment, it seems more like intrusive thoughts, racing in nature as well as some internal dialogue; he reports negative thoughts, such as thinking of himself as worthless, reports feeling scared due to this.  He denies visual hallucinations, denies paranoia, does not seem to be responding to any internal or external stimuli.  Denies first rank symptoms.  There are no overt signs of psychosis.  Patient reports a past suicide attempt, states it was 10 years ago, when he used a rope to time on his neck, and has been planning to hang  himself, but  shortly before kicking off what he was standing on, he changed his mind.  He denies any other attempts in the past, denies any  mental health related hospitalizations, denies that he currently has a mental health provider or therapist.  Patient denies substance use  specifically denies using alcohol, marijuana, cigarettes, or any other substances not mentioned.   Patient reports finances as being his primary stressor, states that he owes 20,00- $30,000 in credit card debts.  Reports another stressor as the inability to get along with his father and older brother.  He reports that he is moderately supportive, and his immediate younger brother is supportive. Reports ,feeling like he is a burden to his family. Reports that his financial problems are causing him problems with his family, and in his personal relationships.  Reports his protective factor as being his younger brother's child who is 60 years old.  Patient reports a history of suicide attempt in his paternal grandfather, states that he completed suicide in 24 by shooting himself with a gun. States he has no access to a gun.  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 Unable to verbally contract for safety outside of the hospital at this time.  [place] Suicide Risk assessment:  1. Do you wish to be dead? YES 2. Have you wished your dead or wished you could go to sleep and not wake up?  YES 3.  Have you actually had thoughts of killing yourself?   YES 4.  Have you been thinking about how you might do this?  YES  5.  Have you had these thoughts and some intention of acting on them? YES 6.  Have you started to work out or worked out the details to kill yourself?  YES 7.  Do you intend to carry out this plan? I don't know. 8. On a scale of 1-5 with 1 being the least severe and 5 being the most severe answer the following questions place for intensity of ideation. 5 9. How many times have you had these thoughts? Uncountable times. 10. When you have the thoughts how long to the last? It depends, but it typically lasts all day, and comes and goes. 11. Control ability.  Could you or can you stop thinking about killing herself or  wanting to die if you want to?  No 12. Are there any things anyone or anything family religion pain of death that stop you from wanting to die or acting on thoughts of committing suicide?  My 30 yo niece. 13.  What sort of reason to do have to think about wanting to die or killing yourself? Financial stressors, feelings of sadness, worsening intrusive thoughts of death.  14. Have you done anything, started to do anything,  or prepared to do anything to end your life? Examples: Took pills, tried to shoot yourself, cut yourself, tried to hang yourself,  took out pills but didn't swallow any, held a gun but changed your mind or it was  grabbed from your hand, went to the roof but didn't jump, collected pills, obtained  a gun, gave away valuables, wrote a will or suicide note, etc. -Yes. Attempted hanging and tied rope around neck, but changed mind ten years ago.  Suicide Risk Assessment  SUICIDE RISK:  Severe:  Frequent, intense, and enduring suicidal ideation, specific plan prior to hospitalization, no subjective intent, but some objective markers of intent (  i.e., choice of lethal method), the method is accessible, Patient has had some preparatory behavior & there is evidence of impaired self-control, severe dysphoria/symptomatology, multiple risk factors present, and few if any protective factors, particularly a lack of social support. Family history is also very significant for suicides-paternal grandfather completed suicide.   Recommendations: Inpatient Behavioral Heath hospitalization for treatment & stabilization of mental status.  Total Time spent with patient: 1.5 hours  Musculoskeletal  Strength & Muscle Tone: within normal limits Gait & Station: normal Patient leans: N/A  Psychiatric Specialty Exam  Presentation General Appearance: Casual  Eye Contact:Fair  Speech:Clear and Coherent  Speech Volume:Normal  Handedness:Right   Mood and Affect  Mood:No data  recorded Affect:Congruent   Thought Process  Thought Processes:Coherent  Descriptions of Associations:Intact  Orientation:Full (Time, Place and Person)  Thought Content:Logical    Hallucinations:Hallucinations: None  Ideas of Reference:None  Suicidal Thoughts:Suicidal Thoughts: Yes, Active SI Active Intent and/or Plan: With Plan; Without Intent  Homicidal Thoughts:Homicidal Thoughts: No  Sensorium  Memory:Immediate Fair  Judgment:Fair  Insight:Fair  Executive Functions  Concentration:Fair  Attention Span:Fair  Recall:Fair  Fund of Knowledge:Fair  Language:Fair   Psychomotor Activity  Psychomotor Activity:Psychomotor Activity: Normal  Assets  Assets:Resilience  Sleep  Sleep:Sleep: Poor   No data recorded  Physical Exam Constitutional:      Appearance: Normal appearance.  HENT:     Head: Normocephalic.  Eyes:     Pupils: Pupils are equal, round, and reactive to light.  Pulmonary:     Effort: Pulmonary effort is normal.  Musculoskeletal:     Cervical back: Normal range of motion.  Neurological:     Mental Status: He is alert.    Review of Systems  Psychiatric/Behavioral:  Positive for depression and suicidal ideas. Negative for hallucinations, memory loss and substance abuse. The patient is nervous/anxious and has insomnia.   All other systems reviewed and are negative.   Blood pressure (!) 139/90, pulse 78, temperature 98.5 F (36.9 C), temperature source Oral, resp. rate 18, SpO2 97%. There is no height or weight on file to calculate BMI.  Past Psychiatric History: Bipolar 2 d/o   Is the patient at risk to self? Yes  Has the patient been a risk to self in the past 6 months? Yes .    Has the patient been a risk to self within the distant past? Yes   Is the patient a risk to others? No   Has the patient been a risk to others in the past 6 months? No   Has the patient been a risk to others within the distant past? No   Past Medical  History: Denies   Family History: suicide completed by paternal grandfather   Last Labs:  Admission on 04/15/2024  Component Date Value Ref Range Status   POC Amphetamine UR 04/15/2024 None Detected  NONE DETECTED (Cut Off Level 1000 ng/mL) Final   POC Secobarbital (BAR) 04/15/2024 None Detected  NONE DETECTED (Cut Off Level 300 ng/mL) Final   POC Buprenorphine (BUP) 04/15/2024 None Detected  NONE DETECTED (Cut Off Level 10 ng/mL) Final   POC Oxazepam (BZO) 04/15/2024 None Detected  NONE DETECTED (Cut Off Level 300 ng/mL) Final   POC Cocaine UR 04/15/2024 None Detected  NONE DETECTED (Cut Off Level 300 ng/mL) Final   POC Methamphetamine UR 04/15/2024 None Detected  NONE DETECTED (Cut Off Level 1000 ng/mL) Final   POC Morphine 04/15/2024 None Detected  NONE DETECTED (Cut Off Level 300 ng/mL) Final  POC Methadone UR 04/15/2024 None Detected  NONE DETECTED (Cut Off Level 300 ng/mL) Final   POC Oxycodone UR 04/15/2024 None Detected  NONE DETECTED (Cut Off Level 100 ng/mL) Final   POC Marijuana UR 04/15/2024 None Detected  NONE DETECTED (Cut Off Level 50 ng/mL) Final    Allergies: Patient has no known allergies.  Medications:  Facility Ordered Medications  Medication   acetaminophen  (TYLENOL ) tablet 650 mg   alum & mag hydroxide-simeth (MAALOX/MYLANTA) 200-200-20 MG/5ML suspension 30 mL   magnesium  hydroxide (MILK OF MAGNESIA) suspension 30 mL   haloperidol  (HALDOL ) tablet 5 mg   And   diphenhydrAMINE  (BENADRYL ) capsule 50 mg   haloperidol  lactate (HALDOL ) injection 5 mg   And   diphenhydrAMINE  (BENADRYL ) injection 50 mg   And   LORazepam  (ATIVAN ) injection 2 mg   haloperidol  lactate (HALDOL ) injection 10 mg   And   diphenhydrAMINE  (BENADRYL ) injection 50 mg   And   LORazepam  (ATIVAN ) injection 2 mg   hydrOXYzine  (ATARAX ) tablet 25 mg   traZODone  (DESYREL ) tablet 50 mg   fluvoxaMINE  (LUVOX ) tablet 25 mg   hydrOXYzine  (ATARAX ) tablet 50 mg   Medical Decision Making   -Recommended for inpatient behavioral health hospitalization. -Ordered baseline labs-TSH, Lipid panel, Ha1c, CBC, CMP, EKG. Vit D, Vit B12. -Ordered Luvox  25 mg Q HS  for OCD, MDD, GAD -Hydroxyzine  25 mg PRN TID for anxiety -Trazodone  50 mg nightly for sleep -Agitation protocol meds as per [facility] educated on medication rationales, benefits and possible side effects and verbalized understanding.   Recommendations  Based on my evaluation the patient appears to have an emergency mental health condition for which I recommend the patient be transferred to an inpatient behavioral health unit for treatment and stabilization.   Donia Snell, NP 04/15/24  1:47 PM

## 2024-04-15 NOTE — ED Notes (Signed)
 Pt sleeping in no acute distress. RR even and unlabored. Environment secured. Will continue to monitor for safety.

## 2024-04-15 NOTE — BH Assessment (Signed)
 Comprehensive Clinical Assessment (CCA) Note  04/15/2024 Clinton Tucker [id number]  Disposition: Per Donia Snell, NP inpatient treatment  is recommended. [facility] to review.   The patient demonstrates the following risk factors for suicide: Chronic risk factors for suicide include: psychiatric disorder of Bipolar II Disorder and demographic factors (male, >37 y/o). Acute risk factors for suicide include: social withdrawal/isolation and loss (financial, interpersonal, professional). Protective factors for this patient include: positive social support, responsibility to others (children, family), and hope for the future. Considering these factors, the overall suicide risk at this point appears to be moderate. Patient is appropriate for outpatient follow up, once stabilized.   Patient is a 38 year old male with a history of bipolar 2 disorder, currently untreated, who presents reporting worsening depression and anxiety for the past 10 years.  Patient states this current episode of hopelessness with suicidal thoughts was triggered by his impulsive spending habits.  Patient states he is obsessed with professional sports team paraphernalia. It's what I do to cope.  He often spends $100-$200 at a time, and he learned today that he is overdrawn in his bank account by over a thousand dollars.  Patient reports this is not the first time, reporting he has overdrawn his account several times throughout the past year.  He is pretty distressed about his current financial situation stating he is feeling pretty hopeless at this point.  He reports he considered suicide by overdose this morning, which is when he called [phone] for help.  Patient reports history of 1 attempt 10 years ago.  He states he had arranged to hang himself and just before moving the chair from under him he  realized what I was doing.  He did not report this attempt to anyone and was not admitted.  He did begin outpatient treatment for a short period,  reporting he was in therapy briefly and then later started on Latuda and another medication.  He reports the medication made him gain weight, so he discontinued.  He is not currently engaged in outpatient services, and has not been for the past 10 years. Patient denies HI, AVH or SA hx.  Patient is uanble to reliably affirm his safety at this time.  Patient is hesitant initially, however eventually agrees to recommended inpatient treatment.  He has never been admitted for inpatient psychiatric treatment and states he is just scared.     Chief Complaint: No chief complaint on file.  Visit Diagnosis: Bipolar II Disorder                             Major Depressive Disorder, recurrent, severe w/o psychotic fx    CCA Screening, Triage and Referral (STR)  Patient Reported Information How did you hear about us ? Legal System  What Is the Reason for Your Visit/Call Today? Patient is a 38 year old male with a history of bipolar 2 disorder, currently untreated, who presents reporting worsening depression and anxiety for the past 10 years.  Patient states this current episode of hopelessness with suicidal thoughts was triggered by his impulsive spending habits.  Patient states he is obsessed with professional sports team paraphernalia. It's what I do to cope.  He often spends $100-$200 at a time, and he learned today that he is overdrawn in his bank account by over thousand dollars.  Patient reports this is not the first time, reporting he has overdrawn his account several times throughout the past year.  He is pretty distressed  about his current financial situation stating he is feeling pretty hopeless at this point.  He reports he considered suicide by overdose this morning, which is when he called [phone] for help.  Patient reports history of 1 attempt 10 years ago.  He is not currently engaged in outpatient services, and has not been for the past 10 years. Patient denies HI, AVH or SA hx.  Patient is uanble  to reliably affirm his safety at this time.  How Long Has This Been Causing You Problems? > than 6 months  What Do You Feel Would Help You the Most Today? Treatment for Depression or other mood problem   Have You Recently Had Any Thoughts About Hurting Yourself? Yes  Are You Planning to Commit Suicide/Harm Yourself At This time? Yes   Flowsheet Row ED from 04/15/2024 in [facility] ED from 08/23/2023 in [facility] Emergency Department at [facility] ED from 01/12/2023 in [facility] Emergency Department at [facility]  C-SSRS RISK CATEGORY High Risk No Risk No Risk    Have you Recently Had Thoughts About Hurting Someone Sherral? No  Are You Planning to Harm Someone at This Time? No  Explanation: No data recorded  Have You Used Any Alcohol or Drugs in the Past 24 Hours? No  How Long Ago Did You Use Drugs or Alcohol? No data recorded What Did You Use and How Much? No data recorded  Do You Currently Have a Therapist/Psychiatrist? No  Name of Therapist/Psychiatrist:    Have You Been Recently Discharged From Any Office Practice or Programs? No  Explanation of Discharge From Practice/Program: No data recorded    CCA Screening Triage Referral Assessment Type of Contact: Face-to-Face  Telemedicine Service Delivery:   Is this Initial or Reassessment?   Date Telepsych consult ordered in [facility]:    Time Telepsych consult ordered in [facility]:    Location of Assessment: [facility] Assessment Services  Provider Location: [facility] Assessment Services   Collateral Involvement: None   Does Patient Have a Automotive engineer Guardian? No  Legal Guardian Contact Information: None  Copy of Legal Guardianship Form: -- (N/A)  Legal Guardian Notified of Arrival: -- (N/A)  Legal Guardian Notified of Pending Discharge: -- (N/A)  If Minor and Not Living with Parent(s), Who has Custody? N/A  Is CPS involved or ever been involved? Never  Is APS involved  or ever been involved? Never   Patient Determined To Be At Risk for Harm To Self or Others Based on Review of Patient Reported Information or Presenting Complaint? Yes, for Self-Harm  Method: -- (N/A, no HI)  Availability of Means: -- (N/A, no HI)  Intent: -- (N/A, no HI)  Notification Required: -- (N/A, no HI)  Additional Information for Danger to Others Potential: -- (N/A, no HI)  Additional Comments for Danger to Others Potential: N/A, no HI  Are There Guns or Other Weapons in Your Home? No  Types of Guns/Weapons: N/A  Are These Weapons Safely Secured?                            -- (N/A)  Who Could Verify You Are Able To Have These Secured: N/A  Do You Have any Outstanding Charges, Pending Court Dates, Parole/Probation? None  Contacted To Inform of Risk of Harm To Self or Others: Law Enforcement    Does Patient Present under Involuntary Commitment? No    [place] of  Residence: [place]   Patient Currently Receiving the Following Services: Not Receiving Services   Determination of Need: Urgent (48 hours)   Options For Referral: Inpatient Hospitalization; Medication Management; Outpatient Therapy     CCA Biopsychosocial Patient Reported Schizophrenia/Schizoaffective Diagnosis in Past: No   Strengths: Patient called 70 for help today.  He has family support and is open to treatment recommendations.   Mental Health Symptoms Depression:  Change in energy/activity; Difficulty Concentrating; Hopelessness; Increase/decrease in appetite; Sleep (too much or little); Tearfulness; Worthlessness   Duration of Depressive symptoms: Duration of Depressive Symptoms: Greater than two weeks   Mania:  Racing thoughts; Recklessness   Anxiety:   Worrying; Tension; Sleep; Restlessness; Difficulty concentrating   Psychosis:  None   Duration of Psychotic symptoms:    Trauma:  None   Obsessions:  Disrupts routine/functioning; Intrusive/time consuming; Poor insight;  Recurrent & persistent thoughts/impulses/images   Compulsions:  Absent insight/delusional; Driven to perform behaviors/acts; Intended to reduce stress or prevent another outcome; Repeated behaviors/mental acts   Inattention:  N/A   Hyperactivity/Impulsivity:  N/A   Oppositional/Defiant Behaviors:  N/A   Emotional Irregularity:  Chronic feelings of emptiness   Other Mood/Personality Symptoms:  NA    Mental Status Exam Appearance and self-care  Stature:  Average   Weight:  Average weight   Clothing:  Casual   Grooming:  Normal   Cosmetic use:  None   Posture/gait:  Normal   Motor activity:  Not Remarkable   Sensorium  Attention:  Normal   Concentration:  Normal   Orientation:  X5   Recall/memory:  Normal   Affect and Mood  Affect:  Depressed; Anxious   Mood:  Depressed   Relating  Eye contact:  Normal   Facial expression:  Depressed; Responsive   Attitude toward examiner:  Cooperative   Thought and Language  Speech flow: Clear and Coherent   Thought content:  Appropriate to Mood and Circumstances   Preoccupation:  Obsessions   Hallucinations:  None   Organization:  Intact   Company secretary of Knowledge:  Average   Intelligence:  Average   Abstraction:  Normal   Judgement:  Impaired   Reality Testing:  Adequate   Insight:  Gaps; Lacking   Decision Making:  Impulsive; Vacilates   Social Functioning  Social Maturity:  Isolates; Responsible   Social Judgement:  Normal   Stress  Stressors:  Surveyor, quantity; Transitions   Coping Ability:  Exhausted   Skill Deficits:  Self-control; Decision making; Interpersonal   Supports:  Friends/Service system; Family     Religion: Religion/Spirituality Are You A Religious Person?: No How Might This Affect Treatment?: NA  Leisure/Recreation: Leisure / Recreation Do You Have Hobbies?: Yes Leisure and Hobbies: collecting sports memorabilia  Exercise/Diet: Exercise/Diet Do You  Exercise?: No Have You Gained or Lost A Significant Amount of Weight in the Past Six Months?: No Do You Follow a Special Diet?: No Do You Have Any Trouble Sleeping?: Yes Explanation of Sleeping Difficulties: only sleeps 4-5 hours nightly   CCA Employment/Education Employment/Work Situation: Employment / Work Situation Employment Situation: Employed 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?: No  Education: Education Is Patient Currently Attending School?: No Last Grade Completed: 12 Did You Product manager?: No Did You Have An Individualized Education Program (IIEP): No Did You Have Any Difficulty At [employer]?: No Patient's Education Has Been  Impacted by Current Illness: No   CCA Family/Childhood History Family and Relationship History: Family history Marital status: Single Does patient have children?: No  Childhood History:  Childhood History By whom was/is the patient raised?: Both parents Did patient suffer any verbal/emotional/physical/sexual abuse as a child?: No Did patient suffer from severe childhood neglect?: No Has patient ever been sexually abused/assaulted/raped as an adolescent or adult?: No Was the patient ever a victim of a crime or a disaster?: No Witnessed domestic violence?: No Has patient been affected by domestic violence as an adult?: No       CCA Substance Use Alcohol/Drug Use: Alcohol / Drug Use Pain Medications: See MAR Prescriptions: See MAR Over the Counter: See MAR History of alcohol / drug use?: No history of alcohol / drug abuse Longest period of sobriety (when/how long): N/A                         ASAM's:  Six Dimensions of Multidimensional Assessment  Dimension 1:  Acute Intoxication and/or Withdrawal Potential:      Dimension 2:  Biomedical Conditions and Complications:      Dimension  3:  Emotional, Behavioral, or Cognitive Conditions and Complications:     Dimension 4:  Readiness to Change:     Dimension 5:  Relapse, Continued use, or Continued Problem Potential:     Dimension 6:  Recovery/Living Environment:     ASAM Severity Score:    ASAM Recommended Level of Treatment:     Substance use Disorder (SUD)    Recommendations for Services/Supports/Treatments:    Disposition Recommendation per psychiatric provider: We recommend inpatient psychiatric hospitalization when medically cleared. Patient is under voluntary admission status at this time; please IVC if attempts to leave hospital.   DSM5 Diagnoses: There are no active problems to display for this patient.    Referrals to Alternative Service(s): Referred to Alternative Service(s):   Place:   Date:   Time:    Referred to Alternative Service(s):   Place:   Date:   Time:    Referred to Alternative Service(s):   Place:   Date:   Time:    Referred to Alternative Service(s):   Place:   Date:   Time:     Deland LITTIE Louder, [facility]

## 2024-04-15 NOTE — Progress Notes (Signed)
 Pt has been accepted to [facility] on 04/15/2024. Bed assignment:300-1   Pt meets inpatient criteria per Donia Snell, NP   Attending Physician will be Dr. Zouev   Report can be called to: Adult unit: [phone]  Pt can arrive  pending labs   Care Team Notified: [facility]  Cherylynn Ernst, RN, Donia Snell, NP, Lajuana Nett, RN

## 2024-04-15 NOTE — ED Notes (Signed)
 Pt transferred to [facility] via safe transport. Pt verbalized understanding of need for transport. Nurse to nurse called to [place]. Safe transport called. All belongings given to safe transport driver from locker #[phone] intact. Safety maintained.

## 2024-04-15 NOTE — Progress Notes (Signed)
   04/15/24 2000  Psych Admission Type (Psych Patients Only)  Admission Status Voluntary  Psychosocial Assessment  Patient Complaints Other (Comment) (Fustrated, Overwhelmed)  Eye Contact Fair  Facial Expression Flat  Affect Preoccupied  Speech Soft  Interaction Isolative  Motor Activity Other (Comment) (WNL)  Appearance/Hygiene Disheveled;In scrubs  Behavior Characteristics Unwilling to participate  Thought Process  Coherency WDL  Content WDL  Delusions None reported or observed  Perception WDL  Hallucination None reported or observed  Judgment WDL  Confusion None  Danger to Self  Current suicidal ideation? Passive (Currently Denies)  Agreement Not to Harm Self Yes  Description of Agreement Notify Staff  Danger to Others  Danger to Others None reported or observed

## 2024-04-15 NOTE — Progress Notes (Signed)
   04/15/24 1105  [facility]UC Triage Screening (Walk-ins at [facility] only)  How Did You Hear About Us ? Legal System  What Is the Reason for Your Visit/Call Today? Patient is a 38 year old male with a history of bipolar 2 disorder, currently untreated, who presents reporting worsening depression and anxiety for the past 10 years.  Patient states this current episode of hopelessness with suicidal thoughts was triggered by his impulsive spending habits.  Patient states he is obsessed with professional sports team paraphernalia. It's what I do to cope.  He often spends $100-$200 at a time, and he learned today that he is overdrawn in his bank account by over thousand dollars.  Patient reports this is not the first time, reporting he has overdrawn his account several times throughout the past year.  He is pretty distressed about his current financial situation stating he is feeling pretty hopeless at this point.  He reports he considered suicide by overdose this morning, which is when he called [phone] for help.  Patient reports history of 1 attempt 10 years ago.  He is not currently engaged in outpatient services, and has not been for the past 10 years. Patient denies HI, AVH or SA hx.  Patient is uanble to reliably affirm his safety at this time.  How Long Has This Been Causing You Problems? > than 6 months  Have You Recently Had Any Thoughts About Hurting Yourself? Yes  How long ago did you have thoughts about hurting yourself? This morning, patient was suicidal with a plan to overdose on OTC medications he has at home.  Are You Planning to Commit Suicide/Harm Yourself At This time? Yes  Have you Recently Had Thoughts About Hurting Someone Sherral? No  Are You Planning To Harm Someone At This Time? No  Physical Abuse Denies  Verbal Abuse Denies  Sexual Abuse Denies  Exploitation of patient/patient's resources Denies  Self-Neglect Denies  Possible abuse reported to:  (N/A)  Are you currently experiencing any auditory,  visual or other hallucinations? No  Have You Used Any Alcohol or Drugs in the Past 24 Hours? No  Do you have any current medical co-morbidities that require immediate attention? No  Clinician description of patient physical appearance/behavior: Patient is depressed, tearful AAOx5  What Do You Feel Would Help You the Most Today? Treatment for Depression or other mood problem  If access to [facility] was not available, would you have sought care in the Emergency Department? No  Determination of Need Urgent (48 hours)  Options For Referral Inpatient Hospitalization;Medication Management;Outpatient Therapy  Determination of Need filed? Yes

## 2024-04-16 DIAGNOSIS — F332 Major depressive disorder, recurrent severe without psychotic features: Secondary | ICD-10-CM | POA: Insufficient documentation

## 2024-04-16 LAB — HEMOGLOBIN A1C
Hgb A1c MFr Bld: 5.2 % (ref 4.8–5.6)
Mean Plasma Glucose: 103 mg/dL

## 2024-04-16 NOTE — Plan of Care (Signed)
  Problem: Education: Goal: Knowledge of [facility] Education information/materials will improve Outcome: Progressing Goal: Emotional status will improve Outcome: Progressing Goal: Mental status will improve Outcome: Progressing Goal: Verbalization of understanding the information provided will improve Outcome: Progressing   Problem: Activity: Goal: Interest or engagement in activities will improve Outcome: Progressing Goal: Sleeping patterns will improve Outcome: Progressing   Problem: Coping: Goal: Ability to verbalize frustrations and anger appropriately will improve Outcome: Progressing Goal: Ability to demonstrate self-control will improve Outcome: Progressing   Problem: Health Behavior/Discharge Planning: Goal: Identification of resources available to assist in meeting health care needs will improve Outcome: Progressing Goal: Compliance with treatment plan for underlying cause of condition will improve Outcome: Progressing   Problem: Physical Regulation: Goal: Ability to maintain clinical measurements within normal limits will improve Outcome: Progressing   Problem: Safety: Goal: Periods of time without injury will increase Outcome: Progressing   Problem: Education: Goal: Utilization of techniques to improve thought processes will improve Outcome: Progressing Goal: Knowledge of the prescribed therapeutic regimen will improve Outcome: Progressing   Problem: Activity: Goal: Interest or engagement in leisure activities will improve Outcome: Progressing Goal: Imbalance in normal sleep/wake cycle will improve Outcome: Progressing   Problem: Coping: Goal: Coping ability will improve Outcome: Progressing Goal: Will verbalize feelings Outcome: Progressing   Problem: Health Behavior/Discharge Planning: Goal: Ability to make decisions will improve Outcome: Progressing Goal: Compliance with therapeutic regimen will improve Outcome: Progressing    Problem: Role Relationship: Goal: Will demonstrate positive changes in social behaviors and relationships Outcome: Progressing   Problem: Safety: Goal: Ability to disclose and discuss suicidal ideas will improve Outcome: Progressing Goal: Ability to identify and utilize support systems that promote safety will improve Outcome: Progressing   Problem: Self-Concept: Goal: Will verbalize positive feelings about self Outcome: Progressing Goal: Level of anxiety will decrease Outcome: Progressing   Problem: Education: Goal: Ability to make informed decisions regarding treatment will improve Outcome: Progressing   Problem: Coping: Goal: Coping ability will improve Outcome: Progressing   Problem: Health Behavior/Discharge Planning: Goal: Identification of resources available to assist in meeting health care needs will improve Outcome: Progressing   Problem: Medication: Goal: Compliance with prescribed medication regimen will improve Outcome: Progressing   Problem: Self-Concept: Goal: Ability to disclose and discuss suicidal ideas will improve Outcome: Progressing

## 2024-04-16 NOTE — Plan of Care (Signed)
   Problem: Education: Goal: Emotional status will improve Outcome: Progressing Goal: Mental status will improve Outcome: Progressing

## 2024-04-16 NOTE — Progress Notes (Signed)
 Tour of Duty:  Prentice JINNY Angle, RN, 04/16/24, Tour of Duty: 0700-1900  SI/HI/AVH: Denies  Self-Reported   Mood: Neutral  Anxiety: Denies Depression: Denies Irritability: Denies  Broset  Violence Prevention Guidelines *See Row Information*: Small Violence Risk interventions implemented   LBM  Last BM Date : 04/14/24 (States ~ 2 days is normal)   Pain: not present  Patient Refusals (including Rx): No  Shift Summary: Patient observed to be calm but withdrawn on unit. Patient able to make needs known. Patient observed to engage appropriately with staff and peers. This shift, no PRN medication requested or required. No observed or reported side effects to medication. No observed or reported agitation, aggression, or other acute emotional distress. No observed or reported physical abnormalities or concerns.   Last Vitals  Vitals Weight: 86.8 kg Temp: 98.5 F (36.9 C) Temp Source: Oral Pulse Rate: 100 Resp: 18 BP: 132/85 Patient Position: (not recorded)  Admission Type  Psych Admission Type (Psych Patients Only) Admission Status: Voluntary Date 72 hour document signed : (not recorded) Time 72 hour document signed : (not recorded) Provider Notified (First and Last Name) (see details for LINK to note): (not recorded)   Psychosocial Assessment  Psychosocial Assessment Patient Complaints: None Eye Contact: Fair Facial Expression: Anxious Affect: Anxious Speech: Soft Interaction: Cautious Motor Activity: Other (Comment) (WDL) Appearance/Hygiene: Unremarkable Behavior Characteristics: Cooperative, Anxious Mood: Anxious, Depressed   Aggressive Behavior  Targets: (not recorded)   Thought Process  Thought Process Coherency: Within Defined Limits Content: Within Defined Limits Delusions: None reported or observed Perception: Within Defined Limits Hallucination: None reported or observed Judgment: Limited Confusion: None  Danger to Self/Others  Danger to  Self Current suicidal ideation?: Denies Description of Suicide Plan: (not recorded) Self-Injurious Behavior: (not recorded) Agreement Not to Harm Self: (not recorded) Description of Agreement: (not recorded) Danger to Others: None reported or observed

## 2024-04-16 NOTE — H&P (Signed)
 Psychiatric Admission Assessment Adult  Patient Identification: Clinton Tucker MRN:  [id number] Date of Evaluation:  04/16/2024 Chief Complaint:  OCD (obsessive compulsive disorder) [F42.9] Principal Diagnosis: OCD (obsessive compulsive disorder) Diagnosis:  Principal Problem:   OCD (obsessive compulsive disorder)   History of Present Illness:   38 y.o. male with a reported PPH of bipolar 2 d/o who presented to the [facility] this day with complaints of worsening depressive symptoms & SI with a plan to overdose on OTC meds.    PER psych eval yesterday at [facility] :On assessment, patient reports that he called the suicide hotline number today, due to worsening intrusive thoughts of suicide.  He shares that he has been depressed since he was in elementary school.  Recalls being depressed since age 18 years old.  Reports that he was last on meds >10 yrs ago for bipolar 2 d/o, shares that he was on Lamictal as well as another medication which he is unable to recall name. States that medication led to so much weight gain that he was forced. to stop them, and has not been on any others.Patient describes symptoms that are more significant for OCD & MDD, rather than bipolar d/o; he describes persistent, and recurrent intrusive thoughts to get football paraphernalia, is able to recognize that these thoughts are irrational, and that he should not be indulging in them, yet, he is unable to stop himself from compulsively & impulsively purchasing these items. He reports that the compulsive shopping for paraphernalia has been what has been elevating his mood,  but it has been most recently, causing him financial difficulties. He shares that he woke up today morning to find out that his bank account was overdrafted by $200, but caused his suicidal thoughts to worsen, rendering him to call the suicide hotline number, leading to the presentation at this location.  Patient reports insomnia, sleeping for 4 to 5 hours nightly,  anhedonia, states only thing that makes him happy with the compulsive shopping, but he cannot do that anymore due to financial issues.  He reports feelings of guilt regarding his excessive spending.  Reports decreased energy levels, poor concentration levels, decreased appetite, describes symptoms consistent with psychomotor retardation; difficulty getting his mind coordinated with his physical body to do things that positively impact him.  He reports worsening mental clouding, as well as intrusive suicidal thoughts.  Reports that the symptoms above have been going on for at least the past year, worsening in the past few months. Patient describes symptoms significant for GAD and panic  disorder; reports restlessness, excessive worrying, as well as muscle tension.  Reports situations where he feels like he cannot breath, where his mind is in a fight or flight mode, reports having panic attacks while driving at times, rendering him to pull car by the side of the road.  Patient reports auditory hallucinations, but on further assessment, it seems more like intrusive thoughts, racing in nature as well as some internal dialogue; he reports negative thoughts, such as thinking of himself as worthless, reports feeling scared due to this.  He denies visual hallucinations, denies paranoia, does not seem to be responding to any internal or external stimuli.  Denies first rank symptoms.  There are no overt signs of psychosis.Patient reports a past suicide attempt, states it was 10 years ago, when he used a rope to time on his neck, and has been planning to hang himself, but  shortly before kicking off what he was standing on, he changed his mind.  He denies any other attempts in the past, denies any  mental health related hospitalizations, denies that he currently has a mental health provider or therapist.Patient denies substance use specifically denies using alcohol, marijuana, cigarettes, or any other substances not  mentioned. Patient reports finances as being his primary stressor, states that he owes 20,00- $30,000 in credit card debts.  Reports another stressor as the inability to get along with his father and older brother.  He reports that he is moderatelysupportive, and his immediate younger brother is supportive. Reports ,feeling like he is a burden to his family. Reports that his financial problems are causing him problems with his family, and in his personal relationships.Reports his protective factor as being his younger brother's child who is 16 years old.States he has no access to a gun.   Patient confirms above history reporting primarily financial stressors of 20-30 k  of debt. States since  38 years old he has had intrusive thoughts of purchasing items online and that I feel like I'm not enough If I don't purchase them... then I get a momentary high for a while. Patient denies any history of symptoms consistent with manic episodes. Reports symptoms over the past 2 months consistent with a major depressive episode. States he was having suicidal thoughts yesterday and has 1 prior suicide attempt 10 years ago where he thought about hanging himself but decided not to. Patient states he was prescribed lamictal and another mood stabilizer but denied any benefit despite taking for 6 months. States they just took my joy away and made me gain weight.  Patient denies AVH. Denies paranoia or any delusions. Denies PTSD symptoms.  Associated Signs/Symptoms: Depression Symptoms:  depressed mood, anhedonia, insomnia, psychomotor retardation, fatigue, feelings of worthlessness/guilt, difficulty concentrating, hopelessness, impaired memory, recurrent thoughts of death, suicidal thoughts without plan, anxiety, panic attacks, loss of energy/fatigue, disturbed sleep, weight loss, Duration of Depression Symptoms: Greater than two weeks  (Hypo) Manic Symptoms:  denies all symptoms typical for manic episode  such as decreased sleep, increased energy, pressured speech, irritable mood, euphoria, elevated mood, tangential thoughts of risk taking behaviors Anxiety Symptoms:  Excessive Worry, Panic Symptoms, Obsessive Compulsive Symptoms:   intrusive thoughts daily, Psychotic Symptoms:  denies AVH, denies delusions PTSD Symptoms: Negative Total Time spent with patient: 45 minutes  Past Psychiatric Hx: Previous Psych Diagnoses: Bipolar, depression anxiety Prior inpatient treatment: denies Current/prior outpatient treatment: denies Prior rehab hx: denies Psychotherapy hx:  10 years ago History of suicide:10 years ago, planned to hang himself but decided not to History of homicide: denies Psychiatric medication history: lamictal 10 years ago Not following outpatient currenlty  Substance Abuse Hx: Alcohol:  denies Tobacco: denies Illicit drugs denies Rx drug abuse: denies Rehab hx: denies  Past Medical History: Patient denies  Family History: Patient reports a history of suicide attempt in his paternal grandfather, states that he completed suicide in 68 by shooting himself with a gun.    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   Is the patient at risk to self? Yes.    Has the patient been a risk to self in the past 6 months? Yes.    Has the patient been a risk to self within the distant past? Yes.    Is the patient a risk to others? No.  Has the patient been a risk to others in the past 6 months? No.  Has the patient been  a risk to others within the distant past? No.    Alcohol Screening:  1. How often do you have a drink containing alcohol?: Never 2. How many drinks containing alcohol do you have on a typical day when you are drinking?: 1 or 2 3. How often do you have six or more drinks on one occasion?: Never AUDIT-C Score: 0 4. How often during the last year have you found that you were not able  to stop drinking once you had started?: Never 5. How often during the last year have you failed to do what was normally expected from you because of drinking?: Never 6. How often during the last year have you needed a first drink in the morning to get yourself going after a heavy drinking session?: Never 7. How often during the last year have you had a feeling of guilt of remorse after drinking?: Never 8. How often during the last year have you been unable to remember what happened the night before because you had been drinking?: Never 9. Have you or someone else been injured as a result of your drinking?: No 10. Has a relative or friend or a doctor or another health worker been concerned about your drinking or suggested you cut down?: No Alcohol Use Disorder Identification Test Final Score (AUDIT): 0 Substance Abuse History in the last 12 months:  No. Consequences of Substance Abuse: Negative Previous Psychotropic Medications: Yes  Psychological Evaluations: Yes  Past Medical History: History reviewed. No pertinent past medical history.  Past Surgical History:  Procedure Laterality Date   CYSTECTOMY     Family History:  Family History  Problem Relation Age of Onset   Diabetes Mother    Hypertension Mother    Diabetes Father    Hypertension Father    Family Psychiatric  History: see HPI Tobacco Screening:   Social History:  Social History   Substance and Sexual Activity  Alcohol Use Yes     Social History   Substance and Sexual Activity  Drug Use Never    Additional Social History: Marital status: Single Does patient have children?: No                         Allergies:  No Known Allergies Lab Results:  Results for orders placed or performed during the hospital encounter of 04/15/24 (from the past 48 hours)  Urinalysis, Routine w reflex microscopic -Urine, Clean Catch     Status: None   Collection Time: 04/15/24 12:00 PM  Result Value Ref Range   Color,  Urine YELLOW YELLOW   APPearance CLEAR CLEAR   Specific Gravity, Urine 1.016 1.005 - 1.030   pH 6.0 5.0 - 8.0   Glucose, UA NEGATIVE NEGATIVE mg/dL   Hgb urine dipstick NEGATIVE NEGATIVE   Bilirubin Urine NEGATIVE NEGATIVE   Ketones, ur NEGATIVE NEGATIVE mg/dL   Protein, ur NEGATIVE NEGATIVE mg/dL   Nitrite NEGATIVE NEGATIVE   Leukocytes,Ua NEGATIVE NEGATIVE    Comment: Performed at [facility], [address] [place], [place] [address]  CBC with Differential/Platelet     Status: None   Collection Time: 04/15/24 12:53 PM  Result Value Ref Range   WBC 8.2 4.0 - 10.5 K/uL   RBC 5.51 4.22 - 5.81 MIL/uL   Hemoglobin 15.3 13.0 - 17.0 g/dL   HCT 54.2 60.9 - 47.9 %   MCV 82.9 80.0 - 100.0 fL   MCH 27.8 26.0 - 34.0 pg  MCHC 33.5 30.0 - 36.0 g/dL   RDW 87.1 88.4 - 84.4 %   Platelets 249 150 - 400 K/uL   nRBC 0.0 0.0 - 0.2 %   Neutrophils Relative % 75 %   Neutro Abs 6.2 1.7 - 7.7 K/uL   Lymphocytes Relative 15 %   Lymphs Abs 1.2 0.7 - 4.0 K/uL   Monocytes Relative 8 %   Monocytes Absolute 0.7 0.1 - 1.0 K/uL   Eosinophils Relative 1 %   Eosinophils Absolute 0.1 0.0 - 0.5 K/uL   Basophils Relative 1 %   Basophils Absolute 0.0 0.0 - 0.1 K/uL   Immature Granulocytes 0 %   Abs Immature Granulocytes 0.03 0.00 - 0.07 K/uL    Comment: Performed at [facility], [address] [place], [place] [address]  Comprehensive metabolic panel     Status: None   Collection Time: 04/15/24 12:53 PM  Result Value Ref Range   Sodium 138 135 - 145 mmol/L   Potassium 4.1 3.5 - 5.1 mmol/L   Chloride 104 98 - 111 mmol/L   CO2 22 22 - 32 mmol/L   Glucose, Bld 82 70 - 99 mg/dL    Comment: Glucose reference range applies only to samples taken after fasting for at least 8 hours.   BUN 9 6 - 20 mg/dL   Creatinine, Ser 9.18 0.61 - 1.24 mg/dL   Calcium 9.7 8.9 - 89.6 mg/dL   Total Protein 7.2 6.5 - 8.1 g/dL   Albumin 4.2 3.5 - 5.0 g/dL   AST 33 15 - 41 U/L   ALT 44 0 - 44 U/L   Alkaline  Phosphatase 64 38 - 126 U/L   Total Bilirubin 1.2 0.0 - 1.2 mg/dL   GFR, Estimated >39 >39 mL/min    Comment: (NOTE) Calculated using the CKD-EPI Creatinine Equation (2021)    Anion gap 12 5 - 15    Comment: Performed at [facility], [address], [place], [place] [address]  Hemoglobin A1c     Status: None   Collection Time: 04/15/24 12:53 PM  Result Value Ref Range   Hgb A1c MFr Bld 5.2 4.8 - 5.6 %    Comment: (NOTE)         Prediabetes: 5.7 - 6.4         Diabetes: >6.4         Glycemic control for adults with diabetes: <7.0    Mean Plasma Glucose 103 mg/dL    Comment: (NOTE) Performed At: [facility] [address] [place], [place] [id number] Jennette Shorter MD Ey:[id number]   Magnesium      Status: None   Collection Time: 04/15/24 12:53 PM  Result Value Ref Range   Magnesium  1.9 1.7 - 2.4 mg/dL    Comment: Performed at [facility], [address], [place], [place] [address]  Ethanol     Status: None   Collection Time: 04/15/24 12:53 PM  Result Value Ref Range   Alcohol, Ethyl (B) <15 <15 mg/dL    Comment: (NOTE) For medical purposes only. Performed at [facility], [address], [place], [place] [address]   Lipid panel     Status: Abnormal   Collection Time: 04/15/24 12:53 PM  Result Value Ref Range   Cholesterol 213 (H) 0 - 200 mg/dL   Triglycerides 894 <849 mg/dL   HDL 59 >59 mg/dL   Total CHOL/HDL Ratio 3.6 RATIO   VLDL 21 0 - 40 mg/dL  LDL Cholesterol 133 (H) 0 - 99 mg/dL    Comment:        Total Cholesterol/HDL:CHD Risk Coronary Heart Disease Risk Table                     Men   Women  1/2 Average Risk   3.4   3.3  Average Risk       5.0   4.4  2 X Average Risk   9.6   7.1  3 X Average Risk  23.4   11.0        Use the calculated Patient Ratio above and the CHD Risk Table to determine the patient's CHD Risk.        ATP III CLASSIFICATION (LDL):  <100     mg/dL   Optimal  899-870  mg/dL   Near or Above                     Optimal  130-159  mg/dL   Borderline  839-810  mg/dL   High  >809     mg/dL   Very High Performed at [facility], [address] [place], [place] [address]   TSH     Status: None   Collection Time: 04/15/24 12:53 PM  Result Value Ref Range   TSH 0.985 0.350 - 4.500 uIU/mL    Comment: Performed by a 3rd Generation assay with a functional sensitivity of <=0.01 uIU/mL. Performed at [facility], [address] [place], [place] [address]   VITAMIN D  25 Hydroxy (Vit-D Deficiency, Fractures)     Status: Abnormal   Collection Time: 04/15/24 12:53 PM  Result Value Ref Range   Vit D, 25-Hydroxy 12.58 (L) 30 - 100 ng/mL    Comment: (NOTE) Vitamin D  deficiency has been defined by the Institute of Medicine  and an Endocrine Society practice guideline as a level of serum 25-OH  vitamin D  less than 20 ng/mL (1,2). The Endocrine Society went on to  further define vitamin D  insufficiency as a level between 21 and 29  ng/mL (2).  1. IOM (Institute of Medicine). 2010. Dietary reference intakes for  calcium and D. [place]: [employer]. 2. Holick MF, Binkley Loco, Bischoff-Ferrari HA, et al. Evaluation,  treatment, and prevention of vitamin D  deficiency: an Endocrine  Society clinical practice guideline, JCEM. 2011 Jul; 96(7): 1911-30.  Performed at [facility], [address] [place], [place] [address]   Vitamin B12     Status: None   Collection Time: 04/15/24 12:53 PM  Result Value Ref Range   Vitamin B-12 234 180 - 914 pg/mL    Comment: (NOTE) This assay is not validated for testing neonatal or myeloproliferative syndrome specimens for Vitamin B12 levels. Performed at [facility], [address], [place], [place] [address]   POCT Urine Drug Screen - (I-Screen)     Status: Normal   Collection Time: 04/15/24 12:58 PM  Result Value Ref Range   POC Amphetamine UR None Detected NONE DETECTED (Cut Off Level 1000 ng/mL)   POC Secobarbital  (BAR) None Detected NONE DETECTED (Cut Off Level 300 ng/mL)   POC Buprenorphine (BUP) None Detected NONE DETECTED (Cut Off Level 10 ng/mL)   POC Oxazepam (BZO) None Detected NONE DETECTED (Cut Off Level 300 ng/mL)   POC Cocaine UR None Detected NONE DETECTED (Cut Off Level 300 ng/mL)   POC Methamphetamine UR None Detected NONE DETECTED (  Cut Off Level 1000 ng/mL)   POC Morphine None Detected NONE DETECTED (Cut Off Level 300 ng/mL)   POC Methadone UR None Detected NONE DETECTED (Cut Off Level 300 ng/mL)   POC Oxycodone UR None Detected NONE DETECTED (Cut Off Level 100 ng/mL)   POC Marijuana UR None Detected NONE DETECTED (Cut Off Level 50 ng/mL)    Blood Alcohol level:  Lab Results  Component Value Date   [facility] <15 04/15/2024   ETH <5 12/10/2016    Metabolic Disorder Labs:  Lab Results  Component Value Date   HGBA1C 5.2 04/15/2024   MPG 103 04/15/2024   No results found for: PROLACTIN Lab Results  Component Value Date   CHOL 213 (H) 04/15/2024   TRIG 105 04/15/2024   HDL 59 04/15/2024   CHOLHDL 3.6 04/15/2024   VLDL 21 04/15/2024   LDLCALC 133 (H) 04/15/2024    Current Medications: Current Facility-Administered Medications  Medication Dose Route Frequency Provider Last Rate Last Admin   acetaminophen  (TYLENOL ) tablet 650 mg  650 mg Oral Q6H PRN Nkwenti, Doris, NP       alum & mag hydroxide-simeth (MAALOX/MYLANTA) 200-200-20 MG/5ML suspension 30 mL  30 mL Oral Q4H PRN Nkwenti, Doris, NP       haloperidol  (HALDOL ) tablet 5 mg  5 mg Oral TID PRN Tex Drilling, NP       And   diphenhydrAMINE  (BENADRYL ) capsule 50 mg  50 mg Oral TID PRN Tex Drilling, NP       haloperidol  lactate (HALDOL ) injection 10 mg  10 mg Intramuscular TID PRN Tex Drilling, NP       And   diphenhydrAMINE  (BENADRYL ) injection 50 mg  50 mg Intramuscular TID PRN Tex Drilling, NP       And   LORazepam  (ATIVAN ) injection 2 mg  2 mg Intramuscular TID PRN Tex Drilling, NP       haloperidol  lactate  (HALDOL ) injection 5 mg  5 mg Intramuscular TID PRN Tex Drilling, NP       And   diphenhydrAMINE  (BENADRYL ) injection 50 mg  50 mg Intramuscular TID PRN Tex Drilling, NP       And   LORazepam  (ATIVAN ) injection 2 mg  2 mg Intramuscular TID PRN Tex Drilling, NP       fluvoxaMINE  (LUVOX ) tablet 25 mg  25 mg Oral QHS Tex Drilling, NP   25 mg at 04/15/24 2117   hydrOXYzine  (ATARAX ) tablet 25 mg  25 mg Oral TID PRN Tex Drilling, NP   25 mg at 04/15/24 2117   magnesium  hydroxide (MILK OF MAGNESIA) suspension 30 mL  30 mL Oral Daily PRN Tex Drilling, NP       traZODone  (DESYREL ) tablet 50 mg  50 mg Oral QHS PRN Tex Drilling, NP       PTA Medications: Medications Prior to Admission  Medication Sig Dispense Refill Last Dose/Taking   ibuprofen (ADVIL) 200 MG tablet Take 800 mg by mouth every 6 (six) hours as needed (For headache or pain).       Musculoskeletal: Strength & Muscle Tone: within normal limits Gait & Station: normal Patient leans: N/A    Psychiatric Specialty Exam:  Presentation  General Appearance: Appropriate for Environment   Eye Contact:Fair   Speech:Clear and Coherent   Speech Volume:Normal   Handedness:Right   Mood and Affect  Mood:Dysphoric; Anxious; Depressed   Affect:Blunt    Thought Process  Thought Processes:Goal Directed   Duration of Psychotic Symptoms: No data recorded Past Diagnosis  of Schizophrenia or Psychoactive disorder: No  Descriptions of Associations:Intact   Orientation:None   Thought Content:Logical   Hallucinations:Hallucinations: None   Ideas of Reference:None   Suicidal Thoughts: denies SI today  Homicidal Thoughts:Homicidal Thoughts: No    Sensorium  Memory:Immediate Fair   Judgment:Fair   Insight:Fair    Executive Functions  Concentration:Fair   Attention Span:Fair   Recall:Fair   Fund of Knowledge:Fair   Language:Fair    Psychomotor Activity  Psychomotor  Activity:Psychomotor Activity: Normal    Assets  Assets:Communication Skills; Desire for Improvement; Financial Resources/Insurance; Housing; Resilience; Social Support; Transportation; Physical Health    Sleep  Sleep:Sleep: Fair     Physical Exam:  General: Well developed, well nourished, balding Caucasian male Pupils: Normal at 3mm Respiratory: Breathing is unlabored.  Cardiovascular: No edema.  Language: No anomia, no aphasia Muscle strength and tone-pt moving all extremities.  Gait not assessed as pt remained in bed.  Neuro: Facial muscles are symmetric. Pt without tremor, no evidence of hyperarousal.  Review of Systems  Constitutional: Negative.   HENT: Negative.    Eyes: Negative.   Respiratory: Negative.    Cardiovascular: Negative.   Gastrointestinal: Negative.   Genitourinary: Negative.   Musculoskeletal: Negative.   Skin: Negative.   Psychiatric/Behavioral:  Positive for depression and suicidal ideas. The patient is nervous/anxious and has insomnia.    Blood pressure 132/85, pulse 100, temperature 98.5 F (36.9 C), temperature source Oral, resp. rate 18, height 5' 9 (1.753 m), weight 86.8 kg, SpO2 98%. Body mass index is 28.26 kg/m.   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 Describes symptoms of obsessive intrusive thoughts of spending money on memorobilia that are fairly consistent with OCD or a compulsive disorder. Patient does not get relief until he buys items and then feels depressed and suicidal as a result of his debt. I dicussed that he would benefit from treatment for both OCD and MDD and discussed exposure response prevention therapy. Patient states he feels better today than yesterday and denies SI today. Agreeable to Luvox  titration. Patient does have  history of a suicide attempt 10 years ago.  [facility] day 1.   Treatment Plan Summary: Daily contact with patient to assess and evaluate symptoms and progress in treatment, Medication management, and Plan as below  Observation Level/Precautions:  15 minute checks  Laboratory:  CBC Chemistry Profile Folic Acid HbAIC UDS Vitamin B-12  Psychotherapy:    Medications:  Luvox , trazodone , hydroxyzine   Consultations:    Discharge Concerns:  suicidality  Estimated LOS:5-7 days  Other:     Safety and Monitoring: voluntarily admission to inpatient psychiatric unit for safety, stabilization and treatment Daily contact with patient to assess and evaluate symptoms and progress in treatment Patient's case to be discussed in multi-disciplinary team meeting Observation Level : q15 minute checks Vital signs: q12 hours Precautions: suicide, elopement, and assault  2. 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 Medical Management Covid negative CMP: wnl CBC: unremarkable EtOH: <10 UDS:neg TSH: wnl A1C: 5.2 Lipids: total chol is 213, LDL of 133 B12 is 234, started on replacement  #  denies medical conditions  Physician Treatment Plan for Primary Diagnosis: OCD (obsessive compulsive disorder) Long Term Goal(s): Improvement in symptoms so as ready for discharge  Short Term Goals: Ability to identify changes in lifestyle to reduce recurrence of condition will improve, Ability to verbalize feelings will improve, Ability to disclose and discuss suicidal ideas, Ability to demonstrate self-control will improve, Ability to identify and develop effective coping behaviors will improve, Ability to maintain clinical measurements  within normal limits will improve, Compliance with prescribed medications will improve, and Ability to identify triggers associated with substance abuse/mental health issues will improve  Physician Treatment Plan for Secondary Diagnosis: Principal Problem:   OCD (obsessive compulsive disorder)    I certify that inpatient services furnished can reasonably be expected to improve the patient's condition.    Dylin Ihnen, MD 8/7/20255:01 PM
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
## 2024-04-16 NOTE — Progress Notes (Signed)
   04/16/24 2010  Psych Admission Type (Psych Patients Only)  Admission Status Voluntary  Psychosocial Assessment  Patient Complaints None  Eye Contact Fair  Facial Expression Flat  Affect Appropriate to circumstance  Speech Soft  Interaction Evasive  Motor Activity Other (Comment) (WNL)  Appearance/Hygiene In scrubs;Disheveled  Behavior Characteristics Appropriate to situation  Mood Preoccupied  Thought Process  Coherency WDL  Content WDL  Delusions None reported or observed  Perception WDL  Hallucination None reported or observed  Judgment Limited  Confusion None  Danger to Self  Current suicidal ideation?  (Denies)  Agreement Not to Harm Self Yes  Description of Agreement Notify Staff  Danger to Others  Danger to Others None reported or observed

## 2024-04-16 NOTE — Group Note (Signed)
 LCSW Group Therapy Note   Group Date: 04/16/2024 Start Time: 1100 End Time: 1200   Participation:  patient was present and actively participated in the discussion.   Type of Therapy:  Group Therapy  Topic:  Finding Balance: Using Wise Mind for Thoughtful Decisions  Objective:  To help participants understand and apply the concept of Delsie Mind to make balanced, thoughtful decisions by integrating emotion and logic.  Goals: Learn the differences between Emotional Mind, Reasonable Mind, and [employer]. Recognize personal signs of Emotional and Reasonable Mind. Practice using [employer] in real-life scenarios.  Therapeutic Modalities: Elements of Dialectical Behavior Therapy (DBT):  Mindfulness (noticing thoughts and emotions without judgment), Emotion Regulation (understanding and managing emotional responses), Distress Tolerance (coping with difficult situations without making them worse), Wise Mind (integrating emotion and reason for balanced decision-making) Elements of Cognitive Behavioral Therapy (CBT):  Identifying automatic thoughts, Challenging cognitive distortions, Using logic to reframe unhelpful thinking patterns  Summary:  This class focused on Wise Mind - DBT's concept of balancing Emotional Mind and Reasonable Mind. We identified when we're in each state and practiced using Wise Mind to respond thoughtfully in real-life situations. By combining emotion and logic, participants can improve decision-making, manage challenges, and enhance relationships.   Shant Hence O Draken Farrior, LCSWA 04/16/2024  12:05 PM

## 2024-04-16 NOTE — Group Note (Signed)
 Date:  04/16/2024 Time:  9:27 PM  Group Topic/Focus:  Wrap-Up Group:   The focus of this group is to help patients review their daily goal of treatment and discuss progress on daily workbooks.    Participation Level:  Active  Participation Quality:  Appropriate and Attentive  Affect:  Appropriate  Cognitive:  Alert and Appropriate  Insight: Appropriate and Good  Engagement in Group:  Engaged  Modes of Intervention:  Discussion and Education  Additional Comments:  Pt attended and participated in wrap up group this evening and rated their day an 8/10. Pt stated that they had a good day socializing with their peers. Pt completed their goal, to be more social by approaching more people and being more open to others. Pt has no concerns to relay at this moment.   Rosella DELENA Pouch 04/16/2024, 9:27 PM

## 2024-04-16 NOTE — BHH Suicide Risk Assessment (Signed)
 [facility] Admission Suicide Risk Assessment   Nursing information obtained from:  Patient Demographic factors:  Male Current Mental Status:  Suicidal ideation indicated by patient Loss Factors:  Loss of significant relationship, Financial problems / change in socioeconomic status Historical Factors:  Prior suicide attempts (2015 per patient) Risk Reduction Factors:  Living with another person, especially a relative  Total Time spent with patient: 45 minutes Principal Problem: OCD (obsessive compulsive disorder) Diagnosis:  Principal Problem:   OCD (obsessive compulsive disorder) Active Problems:   MDD (major depressive disorder), recurrent severe, without psychosis (HCC)  Subjective Data: see H&P  Continued Clinical Symptoms:  Alcohol Use Disorder Identification Test Final Score (AUDIT): 0 The Alcohol Use Disorders Identification Test, Guidelines for Use in Primary Care, Second Edition.  World Science writer [facility]). Score between 0-7:  no or low risk or alcohol related problems. Score between 8-15:  moderate risk of alcohol related problems. Score between 16-19:  high risk of alcohol related problems. Score 20 or above:  warrants further diagnostic evaluation for alcohol dependence and treatment.   CLINICAL FACTORS:   Depression:   Anhedonia Hopelessness Impulsivity Insomnia Obsessive-Compulsive Disorder   Musculoskeletal: Strength & Muscle Tone: within normal limits Gait & Station: normal Patient leans: N/A  Psychiatric Specialty Exam:  Presentation  General Appearance:  Appropriate for Environment  Eye Contact: Fair  Speech: Clear and Coherent  Speech Volume: Normal  Handedness: Right   Mood and Affect  Mood: Dysphoric; Anxious; Depressed  Affect: Blunt   Thought Process  Thought Processes: Goal Directed  Descriptions of Associations:Intact  Orientation:None  Thought Content:Logical  History of Schizophrenia/Schizoaffective  disorder:No  Duration of Psychotic Symptoms:No data recorded Hallucinations:Hallucinations: None  Ideas of Reference:None  Suicidal Thoughts:Suicidal Thoughts: No SI Active Intent and/or Plan: With Plan; Without Intent  Homicidal Thoughts:Homicidal Thoughts: No   Sensorium  Memory: Immediate Fair  Judgment: Fair  Insight: Fair   Chartered certified accountant: Fair  Attention Span: Fair  Recall: [employer] of Knowledge: Fair  Language: Fair   Psychomotor Activity  Psychomotor Activity: Psychomotor Activity: Normal   Assets  Assets: Manufacturing systems engineer; Desire for Improvement; Financial Resources/Insurance; Housing; Resilience; Social Support; Transportation; Physical Health   Sleep  Sleep: Sleep: Fair    Physical Exam: Physical Exam ROS Blood pressure 132/85, pulse 100, temperature 98.5 F (36.9 C), temperature source Oral, resp. rate 18, height 5' 9 (1.753 m), weight 86.8 kg, SpO2 98%. Body mass index is 28.26 kg/m.   COGNITIVE FEATURES THAT CONTRIBUTE TO RISK:  None    SUICIDE RISK:   Severe:  Frequent, intense, and enduring suicidal ideation, specific plan, no subjective intent, but some objective markers of intent (i.e., choice of lethal method), the method is accessible, some limited preparatory behavior, evidence of impaired self-control, severe dysphoria/symptomatology, multiple risk factors present, and few if any protective factors, particularly a lack of social support.  PLAN OF CARE: admit to inpatient psychiatry  I certify that inpatient services furnished can reasonably be expected to improve the patient's condition.   Arian Murley, MD 04/16/2024, 6:15 PM

## 2024-04-16 NOTE — BH Assessment (Signed)
(  Sleep Hours) - 8.25 (Any PRNs that were needed, meds refused, or side effects to meds)- Hydroxyzine  25 mg PO (Any disturbances and when (visitation, over night)- None (Concerns raised by the patient)- Pt requested to go home but knows he needs the help. (SI/HI/AVH)- Denies

## 2024-04-16 NOTE — Group Note (Signed)
 Occupational Therapy Group Note  Group Topic:Coping Skills  Group Date: 04/16/2024 Start Time: 1430 End Time: 1500 Facilitators: Dot [place] MATSU, OT   Group Description: Group encouraged increased engagement and participation through discussion and activity focused on Coping Ahead. Patients were split up into teams and selected a card from a stack of positive coping strategies. Patients were instructed to act out/charade the coping skill for other peers to guess and receive points for their team. Discussion followed with a focus on identifying additional positive coping strategies and patients shared how they were going to cope ahead over the weekend while continuing hospitalization stay.  Therapeutic Goal(s): Identify positive vs negative coping strategies. Identify coping skills to be used during hospitalization vs coping skills outside of hospital/at home Increase participation in therapeutic group environment and promote engagement in treatment   Participation Level: Engaged   Participation Quality: Independent   Behavior: Appropriate   Speech/Thought Process: Relevant   Affect/Mood: Appropriate   Insight: Fair   Judgement: Fair      Modes of Intervention: Education  Patient Response to Interventions:  Attentive   Plan: Continue to engage patient in OT groups 2 - 3x/week.  04/16/2024  [place] MATSU Dot, OT Mikeal Winstanley, OT

## 2024-04-16 NOTE — BHH Group Notes (Signed)
 Adult Psychoeducational Group Note  Date:  04/16/2024 Time:  9:09 AM  Group Topic/Focus:  Goals Group:   The focus of this group is to help patients establish daily goals to achieve during treatment and discuss how the patient can incorporate goal setting into their daily lives to aide in recovery.  Participation Level:  Active  Participation Quality:  Appropriate  Affect:  Appropriate  Cognitive:  Alert  Insight: Appropriate  Engagement in Group:  Engaged  Modes of Intervention:  Discussion  Additional Comments: Patient attended and participated in the goals group.  Clinton Tucker 04/16/2024, 9:09 AM

## 2024-04-17 ENCOUNTER — Encounter (HOSPITAL_COMMUNITY): Payer: Self-pay

## 2024-04-17 MED ORDER — RISPERIDONE 0.5 MG PO TABS
0.5000 mg | ORAL_TABLET | Freq: Two times a day (BID) | ORAL | Status: DC
  Administered 2024-04-17 – 2024-04-19 (×4): 0.5 mg via ORAL
  Filled 2024-04-17 (×4): qty 1

## 2024-04-17 MED ORDER — RISPERIDONE 0.5 MG PO TABS
0.5000 mg | ORAL_TABLET | Freq: Once | ORAL | Status: AC
  Administered 2024-04-17: 0.5 mg via ORAL
  Filled 2024-04-17: qty 1

## 2024-04-17 NOTE — Progress Notes (Signed)
   04/17/24 2125  Psych Admission Type (Psych Patients Only)  Admission Status Voluntary  Psychosocial Assessment  Patient Complaints None  Eye Contact Fair  Facial Expression Animated  Affect Appropriate to circumstance  Speech Soft  Interaction Assertive  Motor Activity Other (Comment)  Appearance/Hygiene In scrubs  Behavior Characteristics Appropriate to situation  Mood Pleasant  Thought Process  Coherency WDL  Content WDL  Delusions None reported or observed  Perception WDL  Hallucination None reported or observed  Judgment Limited  Confusion None  Danger to Self  Current suicidal ideation?  (Denies)  Agreement Not to Harm Self Yes  Description of Agreement Notify Staff  Danger to Others  Danger to Others None reported or observed

## 2024-04-17 NOTE — Plan of Care (Signed)
  Problem: Education: Goal: Knowledge of [facility] Education information/materials will improve Outcome: Progressing Goal: Emotional status will improve Outcome: Progressing Goal: Mental status will improve Outcome: Progressing Goal: Verbalization of understanding the information provided will improve Outcome: Progressing   Problem: Activity: Goal: Interest or engagement in activities will improve Outcome: Progressing Goal: Sleeping patterns will improve Outcome: Progressing   Problem: Coping: Goal: Ability to verbalize frustrations and anger appropriately will improve Outcome: Progressing Goal: Ability to demonstrate self-control will improve Outcome: Progressing   Problem: Health Behavior/Discharge Planning: Goal: Identification of resources available to assist in meeting health care needs will improve Outcome: Progressing Goal: Compliance with treatment plan for underlying cause of condition will improve Outcome: Progressing   Problem: Physical Regulation: Goal: Ability to maintain clinical measurements within normal limits will improve Outcome: Progressing   Problem: Safety: Goal: Periods of time without injury will increase Outcome: Progressing   Problem: Education: Goal: Utilization of techniques to improve thought processes will improve Outcome: Progressing Goal: Knowledge of the prescribed therapeutic regimen will improve Outcome: Progressing   Problem: Activity: Goal: Interest or engagement in leisure activities will improve Outcome: Progressing Goal: Imbalance in normal sleep/wake cycle will improve Outcome: Progressing   Problem: Coping: Goal: Coping ability will improve Outcome: Progressing Goal: Will verbalize feelings Outcome: Progressing   Problem: Health Behavior/Discharge Planning: Goal: Ability to make decisions will improve Outcome: Progressing Goal: Compliance with therapeutic regimen will improve Outcome: Progressing    Problem: Role Relationship: Goal: Will demonstrate positive changes in social behaviors and relationships Outcome: Progressing   Problem: Safety: Goal: Ability to disclose and discuss suicidal ideas will improve Outcome: Progressing Goal: Ability to identify and utilize support systems that promote safety will improve Outcome: Progressing   Problem: Self-Concept: Goal: Will verbalize positive feelings about self Outcome: Progressing Goal: Level of anxiety will decrease Outcome: Progressing   Problem: Education: Goal: Ability to make informed decisions regarding treatment will improve Outcome: Progressing   Problem: Coping: Goal: Coping ability will improve Outcome: Progressing   Problem: Health Behavior/Discharge Planning: Goal: Identification of resources available to assist in meeting health care needs will improve Outcome: Progressing   Problem: Medication: Goal: Compliance with prescribed medication regimen will improve Outcome: Progressing   Problem: Self-Concept: Goal: Ability to disclose and discuss suicidal ideas will improve Outcome: Progressing

## 2024-04-17 NOTE — Progress Notes (Signed)
 D: Patient is alert, oriented, pleasant, and cooperative. Denies SI, HI, AVH, and verbally contracts for safety. Patient reports he slept good last night with sleeping medication. Patient reports his appetite as good, energy level as normal, and concentration as good. Patient rates his depression 3/10, hopelessness 3/10, and anxiety 3/10. Patient denies physical symptoms/pain.    A: Support provided. Patient educated on safety on the unit and medications. Routine safety checks every 15 minutes. Patient stated understanding to tell nurse about any new physical symptoms. Patient understands to tell staff of any needs.     R: No adverse drug reactions noted. Patient remains safe at this time and will continue to monitor.    04/17/24 1300  Psych Admission Type (Psych Patients Only)  Admission Status Voluntary  Psychosocial Assessment  Patient Complaints None  Eye Contact Fair  Facial Expression Flat  Affect Appropriate to circumstance  Speech Soft  Interaction Cautious  Motor Activity Other (Comment) (WNL)  Appearance/Hygiene Unremarkable  Behavior Characteristics Appropriate to situation;Cooperative  Mood Pleasant  Thought Process  Coherency WDL  Content WDL  Delusions None reported or observed  Perception WDL  Hallucination None reported or observed  Judgment Limited  Confusion None  Danger to Self  Current suicidal ideation? Denies  Agreement Not to Harm Self Yes  Description of Agreement verbal  Danger to Others  Danger to Others None reported or observed

## 2024-04-17 NOTE — BH Assessment (Signed)
(  Sleep Hours) - 6.25 (Any PRNs that were needed, meds refused, or side effects to meds)- Hydroxyzine  25 mg PO/Sleep (Any disturbances and when (visitation, over night)- None (Concerns raised by the patient)- None (SI/HI/AVH)- Denies

## 2024-04-17 NOTE — Group Note (Signed)
 Date:  04/17/2024 Time:  9:20 PM  Group Topic/Focus:  Wrap-Up Group:   The focus of this group is to help patients review their daily goal of treatment and discuss progress on daily workbooks.    Additional Comments:  Pt attended the AA meeting and participated.   Clinton Tucker 04/17/2024, 9:20 PM

## 2024-04-17 NOTE — Plan of Care (Signed)

## 2024-04-17 NOTE — Progress Notes (Signed)
 Adult Psychoeducational Group Note  Date:  04/17/2024 Time:  9:07 AM  Group Topic/Focus:  Goals Group:   The focus of this group is to help patients establish daily goals to achieve during treatment and discuss how the patient can incorporate goal setting into their daily lives to aide in recovery.  Participation Level:  Active  Participation Quality:  Appropriate  Affect:  Appropriate  Cognitive:  Appropriate  Insight: Appropriate  Engagement in Group:  Engaged  Modes of Intervention:  Discussion  Additional Comments:  Pt stated he is feeling good. Pt goal for the day is remain positive and to see if they can get a discharge date.  Daine Pillar D 04/17/2024, 9:07 AM

## 2024-04-17 NOTE — BH IP Treatment Plan (Signed)
 Interdisciplinary Treatment and Diagnostic Plan Update  04/17/2024 Time of Session: 10:55 AM Clinton Tucker MRN: [id number]  Principal Diagnosis: OCD (obsessive compulsive disorder)  Secondary Diagnoses: Principal Problem:   OCD (obsessive compulsive disorder) Active Problems:   MDD (major depressive disorder), recurrent severe, without psychosis (HCC)   Current Medications:  Current Facility-Administered Medications  Medication Dose Route Frequency Provider Last Rate Last Admin   acetaminophen  (TYLENOL ) tablet 650 mg  650 mg Oral Q6H PRN Nkwenti, Doris, NP       alum & mag hydroxide-simeth (MAALOX/MYLANTA) 200-200-20 MG/5ML suspension 30 mL  30 mL Oral Q4H PRN Nkwenti, Doris, NP       haloperidol  (HALDOL ) tablet 5 mg  5 mg Oral TID PRN Tex Drilling, NP       And   diphenhydrAMINE  (BENADRYL ) capsule 50 mg  50 mg Oral TID PRN Tex Drilling, NP       haloperidol  lactate (HALDOL ) injection 10 mg  10 mg Intramuscular TID PRN Tex Drilling, NP       And   diphenhydrAMINE  (BENADRYL ) injection 50 mg  50 mg Intramuscular TID PRN Tex Drilling, NP       And   LORazepam  (ATIVAN ) injection 2 mg  2 mg Intramuscular TID PRN Tex Drilling, NP       haloperidol  lactate (HALDOL ) injection 5 mg  5 mg Intramuscular TID PRN Tex Drilling, NP       And   diphenhydrAMINE  (BENADRYL ) injection 50 mg  50 mg Intramuscular TID PRN Tex Drilling, NP       And   LORazepam  (ATIVAN ) injection 2 mg  2 mg Intramuscular TID PRN Tex Drilling, NP       fluvoxaMINE  (LUVOX ) tablet 25 mg  25 mg Oral QHS Tex Drilling, NP   25 mg at 04/16/24 2137   hydrOXYzine  (ATARAX ) tablet 25 mg  25 mg Oral TID PRN Tex Drilling, NP   25 mg at 04/16/24 2138   magnesium  hydroxide (MILK OF MAGNESIA) suspension 30 mL  30 mL Oral Daily PRN Tex Drilling, NP       traZODone  (DESYREL ) tablet 50 mg  50 mg Oral QHS PRN Tex Drilling, NP       PTA Medications: Medications Prior to Admission  Medication Sig Dispense Refill Last  Dose/Taking   ibuprofen (ADVIL) 200 MG tablet Take 800 mg by mouth every 6 (six) hours as needed (For headache or pain).       Patient Stressors: Financial difficulties   Marital or family conflict    Patient Strengths: Manufacturing systems engineer   Treatment Modalities: Medication Management, Group therapy, Case management,  1 to 1 session with clinician, Psychoeducation, Recreational therapy.   Physician Treatment Plan for Primary Diagnosis: OCD (obsessive compulsive disorder) Long Term Goal(s): Improvement in symptoms so as ready for discharge   Short Term Goals: Ability to identify changes in lifestyle to reduce recurrence of condition will improve Ability to verbalize feelings will improve Ability to disclose and discuss suicidal ideas Ability to demonstrate self-control will improve Ability to identify and develop effective coping behaviors will improve Ability to maintain clinical measurements within normal limits will improve Compliance with prescribed medications will improve Ability to identify triggers associated with substance abuse/mental health issues will improve  Medication Management: Evaluate patient's response, side effects, and tolerance of medication regimen.  Therapeutic Interventions: 1 to 1 sessions, Unit Group sessions and Medication administration.  Evaluation of Outcomes: Not Progressing  Physician Treatment Plan for Secondary Diagnosis: Principal Problem:   OCD (  obsessive compulsive disorder) Active Problems:   MDD (major depressive disorder), recurrent severe, without psychosis (HCC)  Long Term Goal(s): Improvement in symptoms so as ready for discharge   Short Term Goals: Ability to identify changes in lifestyle to reduce recurrence of condition will improve Ability to verbalize feelings will improve Ability to disclose and discuss suicidal ideas Ability to demonstrate self-control will improve Ability to identify and develop effective coping behaviors  will improve Ability to maintain clinical measurements within normal limits will improve Compliance with prescribed medications will improve Ability to identify triggers associated with substance abuse/mental health issues will improve     Medication Management: Evaluate patient's response, side effects, and tolerance of medication regimen.  Therapeutic Interventions: 1 to 1 sessions, Unit Group sessions and Medication administration.  Evaluation of Outcomes: Not Progressing   RN Treatment Plan for Primary Diagnosis: OCD (obsessive compulsive disorder) Long Term Goal(s): Knowledge of disease and therapeutic regimen to maintain health will improve  Short Term Goals: Ability to remain free from injury will improve, Ability to verbalize frustration and anger appropriately will improve, Ability to demonstrate self-control, Ability to participate in decision making will improve, Ability to verbalize feelings will improve, Ability to disclose and discuss suicidal ideas, Ability to identify and develop effective coping behaviors will improve, and Compliance with prescribed medications will improve  Medication Management: RN will administer medications as ordered by provider, will assess and evaluate patient's response and provide education to patient for prescribed medication. RN will report any adverse and/or side effects to prescribing provider.  Therapeutic Interventions: 1 on 1 counseling sessions, Psychoeducation, Medication administration, Evaluate responses to treatment, Monitor vital signs and CBGs as ordered, Perform/monitor CIWA, COWS, AIMS and Fall Risk screenings as ordered, Perform wound care treatments as ordered.  Evaluation of Outcomes: Not Progressing   LCSW Treatment Plan for Primary Diagnosis: OCD (obsessive compulsive disorder) Long Term Goal(s): Safe transition to appropriate next level of care at discharge, Engage patient in therapeutic group addressing interpersonal  concerns.  Short Term Goals: Engage patient in aftercare planning with referrals and resources, Increase social support, Increase ability to appropriately verbalize feelings, Increase emotional regulation, Facilitate acceptance of mental health diagnosis and concerns, Facilitate patient progression through stages of change regarding substance use diagnoses and concerns, Identify triggers associated with mental health/substance abuse issues, and Increase skills for wellness and recovery  Therapeutic Interventions: Assess for all discharge needs, 1 to 1 time with Social worker, Explore available resources and support systems, Assess for adequacy in community support network, Educate family and significant other(s) on suicide prevention, Complete Psychosocial Assessment, Interpersonal group therapy.  Evaluation of Outcomes: Not Progressing   Progress in Treatment: Attending groups: Yes. Participating in groups: Yes. Taking medication as prescribed: patient hasn't been prescribed any medications yet Toleration medication: N/A Family/Significant other contact made: No, will contact:  Olis Viverette [phone] Patient understands diagnosis: No. Discussing patient identified problems/goals with staff: Yes. Medical problems stabilized or resolved: Yes. Denies suicidal/homicidal ideation: Yes. Issues/concerns per patient self-inventory: No.  New problem(s) identified:  No  New Short Term/Long Term Goal(s):    medication stabilization, elimination of SI thoughts, development of comprehensive mental wellness plan.    Patient Goals:  I want to work on my anxiety, depression and OCD.  Discharge Plan or Barriers:  Patient recently admitted. CSW will continue to follow and assess for appropriate referrals and possible discharge planning.    Reason for Continuation of Hospitalization: Depression Medication stabilization Suicidal ideation  Estimated Length of Stay:  5 - 7 days  Last 3  [place] Suicide Severity Risk Score: Flowsheet Row Ad[facility] (Current[facility] Most recent reading at 04/15/2024  6:52 PM ED from 04/15/2024 in [facility] Most recent reading at 04/15/2024 12:31 PM ED from 08/23/2023 in [facility] Department at [facility] Most recent reading at 08/23/2023  1:19 PM  C-SSRS RISK CATEGORY High Risk High Risk No Risk    Last PHQ 2/9 Scores:     No data to display          Scribe for Treatment Team: Lloyd Ayo O Delila Kuklinski, LCSWA 04/17/2024 4:25 PM

## 2024-04-17 NOTE — Progress Notes (Signed)
 [facility] MD Progress Note  04/17/2024 2:11 PM Clinton Tucker  MRN:  [id number] Subjective:  Patient reports some improvement in depression and denies SI, HI or AVH today. Reports fair sleep last night of 6.25 hours. Took 25 mg of hydroxyzine  last night. Patient denies SI, plan or intent today. Denies feeling he would be better of dead. Reports high anxiety but denies any intrusive thoughts of harming himself. Discussed trial of low dose Risperdal  for augmentation as well as treatment of OCD symptoms and anxiety and patient states he is interested in trying. States he feels more hopeful. He has been attending groups and interacting with peers.  Principal Problem: OCD (obsessive compulsive disorder) Diagnosis: Principal Problem:   OCD (obsessive compulsive disorder) Active Problems:   MDD (major depressive disorder), recurrent severe, without psychosis (HCC)  Total Time spent with patient: 30 minutes  Past Psychiatric History: see H&P  Past Medical History: History reviewed. No pertinent past medical history.  Past Surgical History:  Procedure Laterality Date   CYSTECTOMY     Family History:  Family History  Problem Relation Age of Onset   Diabetes Mother    Hypertension Mother    Diabetes Father    Hypertension Father    Family Psychiatric  History: see H&P Social History:  Social History   Substance and Sexual Activity  Alcohol Use Yes     Social History   Substance and Sexual Activity  Drug Use Never    Social History   Socioeconomic History   Marital status: Single    Spouse name: Not on file   Number of children: Not on file   Years of education: Not on file   Highest education level: Not on file  Occupational History   Not on file  Tobacco Use   Smoking status: Never   Smokeless tobacco: Never  Vaping Use   Vaping status: Never Used  Substance and Sexual Activity   Alcohol use: Yes   Drug use: Never   Sexual activity: Not on file  Other Topics Concern   Not on  file  Social History Narrative   Not on file   Social Drivers of Health   Financial Resource Strain: Not on file  Food Insecurity: Food Insecurity Present (04/15/2024)   Hunger Vital Sign    Worried About Running Out of Food in the Last Year: Sometimes true    Ran Out of Food in the Last Year: Sometimes true  Transportation Needs: No Transportation Needs (04/15/2024)   PRAPARE - Administrator, Civil Service (Medical): No    Lack of Transportation (Non-Medical): No  Physical Activity: Not on file  Stress: Not on file  Social Connections: Unknown (11/07/2022)   Received from [facility]   Social Network    Social Network: Not on file   Additional Social History:                         Sleep: Fair Estimated Sleeping Duration (Last 24 Hours): 6.00-6.75 hours  Appetite:  Fair  Current Medications: Current Facility-Administered Medications  Medication Dose Route Frequency Provider Last Rate Last Admin   acetaminophen  (TYLENOL ) tablet 650 mg  650 mg Oral Q6H PRN Nkwenti, Doris, NP       alum & mag hydroxide-simeth (MAALOX/MYLANTA) 200-200-20 MG/5ML suspension 30 mL  30 mL Oral Q4H PRN Nkwenti, Doris, NP       haloperidol  (HALDOL ) tablet 5 mg  5 mg Oral TID PRN Nkwenti,  Doris, NP       And   diphenhydrAMINE  (BENADRYL ) capsule 50 mg  50 mg Oral TID PRN Tex Drilling, NP       haloperidol  lactate (HALDOL ) injection 10 mg  10 mg Intramuscular TID PRN Tex Drilling, NP       And   diphenhydrAMINE  (BENADRYL ) injection 50 mg  50 mg Intramuscular TID PRN Tex Drilling, NP       And   LORazepam  (ATIVAN ) injection 2 mg  2 mg Intramuscular TID PRN Tex Drilling, NP       haloperidol  lactate (HALDOL ) injection 5 mg  5 mg Intramuscular TID PRN Tex Drilling, NP       And   diphenhydrAMINE  (BENADRYL ) injection 50 mg  50 mg Intramuscular TID PRN Tex Drilling, NP       And   LORazepam  (ATIVAN ) injection 2 mg  2 mg Intramuscular TID PRN Tex Drilling, NP        fluvoxaMINE  (LUVOX ) tablet 25 mg  25 mg Oral QHS Nkwenti, Doris, NP   25 mg at 04/16/24 2137   hydrOXYzine  (ATARAX ) tablet 25 mg  25 mg Oral TID PRN Tex Drilling, NP   25 mg at 04/16/24 2138   magnesium  hydroxide (MILK OF MAGNESIA) suspension 30 mL  30 mL Oral Daily PRN Tex Drilling, NP       traZODone  (DESYREL ) tablet 50 mg  50 mg Oral QHS PRN Tex Drilling, NP        Lab Results: No results found for this or any previous visit (from the past 48 hours).  Blood Alcohol level:  Lab Results  Component Value Date   [facility] <15 04/15/2024   ETH <5 12/10/2016    Metabolic Disorder Labs: Lab Results  Component Value Date   HGBA1C 5.2 04/15/2024   MPG 103 04/15/2024   No results found for: PROLACTIN Lab Results  Component Value Date   CHOL 213 (H) 04/15/2024   TRIG 105 04/15/2024   HDL 59 04/15/2024   CHOLHDL 3.6 04/15/2024   VLDL 21 04/15/2024   LDLCALC 133 (H) 04/15/2024     Musculoskeletal: Strength & Muscle Tone: within normal limits Gait & Station: normal Patient leans: N/A  Psychiatric Specialty Exam:  Presentation  General Appearance:  Disheveled  Eye Contact: Fair  Speech: Clear and Coherent  Speech Volume: Normal  Handedness: Right   Mood and Affect  Mood: Depressed; Anxious  Affect: Blunt   Thought Process  Thought Processes: Coherent  Descriptions of Associations:Intact  Orientation:Full (Time, Place and Person)  Thought Content:Logical  History of Schizophrenia/Schizoaffective disorder:No  Duration of Psychotic Symptoms:No data recorded Hallucinations:Hallucinations: None  Ideas of Reference:None  Suicidal Thoughts:Suicidal Thoughts: No  Homicidal Thoughts:Homicidal Thoughts: No   Sensorium  Memory: Immediate Fair  Judgment: Fair  Insight: Fair   Art therapist  Concentration: Fair  Attention Span: Fair  Recall: [employer] of Knowledge: Fair  Language: Fair   Psychomotor Activity   Psychomotor Activity: Psychomotor Activity: Decreased   Assets  Assets: Communication Skills; Desire for Improvement; Resilience; Social Support   Sleep  Sleep: Sleep: Fair    Physical Exam: Physical Exam ROS Blood pressure (!) 124/90, pulse 77, temperature 98.2 F (36.8 C), temperature source Oral, resp. rate 18, height 5' 9 (1.753 m), weight 86.8 kg, SpO2 99%. Body mass index is 28.26 kg/m.   Treatment Plan Summary: Daily contact with patient to assess and evaluate symptoms and progress in treatment, Medication management, and Plan see below   OCD (  obsessive compulsive disorder)  MDD, severe recurrent without psychotic features   Patient presents primarily with symptoms of a major depressive episode after acruing 20-30k in credit card debt. Describes symptoms of obsessive intrusive thoughts of spending money on memorobilia that are fairly consistent with OCD or a compulsive disorder. Patient does not get relief until he buys items and then feels depressed and suicidal as a result of his debt. I dicussed that he would benefit from treatment for both OCD and MDD and discussed exposure response prevention therapy. Patient states he feels better today than yesterday and denies SI today. Agreeable to Luvox  titration. Patient does have history of a suicide attempt 10 years ago.   8/8: patient presents with depression and anxiety. Denies SI, HI or AVH today. Reports high anxiety and racing thoughts but states he feels more hopeful today. Patient is interested in trying low dose Risperdal  for augmentation. Discussed it is also most evidence based medication in OCD of atypical antipsychotics for targeting intrusive thoughts.   Treatment Plan Summary: Daily contact with patient to assess and evaluate symptoms and progress in treatment, Medication management, and Plan as below   Observation Level/Precautions:  15 minute checks  Laboratory:  CBC Chemistry Profile Folic  Acid HbAIC UDS Vitamin B-12  Psychotherapy:    Medications:  Luvox , trazodone , hydroxyzine   Consultations:    Discharge Concerns:  suicidality  Estimated LOS:5-7 days  Other:      Safety and Monitoring: voluntarily admission to inpatient psychiatric unit for safety, stabilization and treatment Daily contact with patient to assess and evaluate symptoms and progress in treatment Patient's case to be discussed in multi-disciplinary team meeting Observation Level : q15 minute checks Vital signs: q12 hours Precautions: suicide, elopement, and assault   2. Psychiatric Problems #MDD, severe recurrent without psychotic features -cont Luvox  50 mg qdaily -start Risperdal  0.5 mg BID for augmentation as well as treatment of anxiety and intrusive thoughts of spending excessive money on memorobilia -continue trazodone  50 mg at bedtime-PRN - cont hydroxyzine  25 mg TID-PRN for anxiety   #OCD -cont Luvox  50 mg qdaily -start Risperdal  0.5 mg BID for augmentation as well as treatment of anxiety and intrusive thoughts of spending excessive money on memorobilia -continue trazodone  50 mg at bedtime-PRN - cont hydroxyzine  25 mg TID-PRN for anxiety   3. Medical Management Covid negative CMP: wnl CBC: unremarkable EtOH: <10 UDS:neg TSH: wnl A1C: 5.2 Lipids: total chol is 213, LDL of 133 B12 is 234, started on replacement   #denies medical conditions   Physician Treatment Plan for Primary Diagnosis: OCD (obsessive compulsive disorder) Long Term Goal(s): Improvement in symptoms so as ready for discharge   Short Term Goals: Ability to identify changes in lifestyle to reduce recurrence of condition will improve, Ability to verbalize feelings will improve, Ability to disclose and discuss suicidal ideas, Ability to demonstrate self-control will improve, Ability to identify and develop effective coping behaviors will improve, Ability to maintain clinical measurements within normal limits will  improve, Compliance with prescribed medications will improve, and Ability to identify triggers associated with substance abuse/mental health issues will improve   Physician Treatment Plan for Secondary Diagnosis: Principal Problem:   OCD (obsessive compulsive disorder)       I certify that inpatient services furnished can reasonably be expected to improve the patient's condition.    Daysean Tinkham, MD 04/17/2024, 2:11 PM

## 2024-04-17 NOTE — Group Note (Signed)
 Recreation Therapy Group Note   Group Topic:Problem Solving  Group Date: 04/17/2024 Start Time: 0930 End Time: 1005 Facilitators: Daymien Goth-McCall, LRT,CTRS Location: [address] Dayroom   Group Topic: Problem Solving  Goal Area(s) Addresses:  Patient will identify positive ways to complete puzzles presented.  Patient will work effectively with peer to solve the puzzles presented in group.  Behavioral Response: Engaged   Intervention: Group Work, Journalist, newspaper  Activity: In pairs, patients were asked to create a game with their teammate. Team's were tasked with designing a game, including a Name, Description of Game, Equipment/Supplies, Rules, and Number of players needed. Teach back used to present their game idea to the group.  Education:  Leisure Scientist, physiological, Special educational needs teacher, Teamwork, Discharge Planning  Education Outcome: Acknowledges education/In group clarification offered/Needs additional education.    Affect/Mood: Appropriate   Participation Level: Engaged   Participation Quality: Independent   Behavior: Appropriate   Speech/Thought Process: Focused   Insight: Good   Judgement: Good   Modes of Intervention: Worksheet   Patient Response to Interventions:  Engaged   Education Outcome:  In group clarification offered    Clinical Observations/Individualized Feedback: Pt was bright and engaged throughout group. Pt was social and interactive with peers in completing the brain teasers. Pt was appropriate and focused during group.      Plan: Continue to engage patient in RT group sessions 2-3x/week.   Sandro Burgo-McCall, LRT,CTRS 04/17/2024 12:04 PM

## 2024-04-17 NOTE — Group Note (Signed)
 Date:  04/17/2024 Time:  3:53 PM  Group Topic/Focus:  Healthy Communication:   The focus of this group is to discuss communication, barriers to communication, as well as healthy ways to communicate with others. We discussed healthy personal boundaries.    Participation Level:  Active  Participation Quality:  Appropriate, Attentive, and Sharing  Affect:  Appropriate  Cognitive:  Alert and Appropriate  Insight: Good  Engagement in Group:  Engaged  Modes of Intervention:  Discussion   Powell JAYSON Sharps 04/17/2024, 3:53 PM

## 2024-04-18 MED ORDER — FLUVOXAMINE MALEATE 50 MG PO TABS
50.0000 mg | ORAL_TABLET | Freq: Every day | ORAL | Status: DC
  Administered 2024-04-18: 50 mg via ORAL
  Filled 2024-04-18: qty 1

## 2024-04-18 NOTE — Group Note (Deleted)
 Date:  04/18/2024 Time:  9:26 PM  Group Topic/Focus:  Recovery Goals:   The focus of this group is to identify appropriate goals for recovery and establish a plan to achieve them.    Participation Level:  Active  Participation Quality:  Appropriate  Affect:  Appropriate  Cognitive:  Appropriate  Insight: Appropriate  Engagement in Group:  Engaged  Modes of Intervention:  Discussion  Additional Comments:    Mayme Profeta L 04/18/2024, 9:26 PM

## 2024-04-18 NOTE — Group Note (Signed)
 LCSW Group Therapy Note  @TD @ 1:15pm  Type of Therapy and Topic:  Group Therapy - Safety  Participation Level:  Did Not Attend   Description of Group This process group involved patients discussing the situations or people in their lives that frequently make them safe or unsafe.  Anxiety was a common factor among all group participants and many of them described home situations that keep them on edge and not able to feel completely safe.  Three questions were addressed during the group:  (1) What makes you feel safe (or unsafe)?  (2) Do you feel safe with yourself and why?  (3) If you don't feel safe, what can you do?  A lengthy discussion ensued in which group members empathized with each other, gave suggestions to one another, and expressed their feelings freely.  Therapeutic Goals Patient will describe what makes them feel safe or unsafe in their everyday lives. Patient will think about and discuss whether they feel safe with themselves and what reasons might contribute to feeling safe or unsafe. Patients will participate in planning for what can be done to help themselves feel safer.   Summary of Patient Progress:  NA   Therapeutic Modalities Cognitive Behavioral Therapy  Rolly Magri O Glenis Musolf, LCSWA 04/18/2024  3:11 PM

## 2024-04-18 NOTE — Group Note (Signed)
 Date:  04/18/2024 Time:  5:22 PM  Group Topic/Focus:  Dimensions of Wellness:   The focus of this group is to introduce the topic of wellness and using collage as a creative outlet for expressing emotions, reducing anxiety, fostering group cohesion and enabling personal insight and healing. .    Participation Level:  Active  Participation Quality:  Appropriate and Attentive  Affect:  Appropriate  Cognitive:  Alert and Appropriate  Insight: Appropriate and Good  Engagement in Group:  Engaged  Modes of Intervention:  Activity, Discussion, Exploration, Rapport Building, Socialization, and Support    Clinton Tucker 04/18/2024, 5:22 PM

## 2024-04-18 NOTE — Group Note (Signed)
 Date:  04/18/2024 Time:  8:56 AM  Group Topic/Focus:  Goals Group:   The focus of this group is to help patients establish daily goals to achieve during treatment and discuss how the patient can incorporate goal setting into their daily lives to aide in recovery.    Participation Level:  Active  Participation Quality:  Appropriate and Attentive  Affect:  Appropriate  Cognitive:  Alert  Insight: Appropriate  Engagement in Group:  Engaged  Modes of Intervention:  Discussion  Additional Comments:   Pt goal is to stay positive.  Clinton Tucker 04/18/2024, 8:56 AM

## 2024-04-18 NOTE — [facility] Group Notes (Signed)
 [facility] Group Notes:  (Nursing/MHT/Case Management/Adjunct)  Date:  04/18/2024  Time: 2000  Type of Therapy:  Wrap up group  Participation Level:  Active  Participation Quality:  Appropriate, Attentive, Sharing, and Supportive  Affect:  Appropriate  Cognitive:  Alert  Insight:  Improving  Engagement in Group:  Engaged  Modes of Intervention:  Clarification, Education, and Support  Summary of Progress/Problems: Positive thinking and positive change were discussed.   Lenora Manuelita RAMAN 04/18/2024, 8:39 PM

## 2024-04-18 NOTE — Plan of Care (Signed)
  Problem: Education: Goal: Knowledge of [facility] Education information/materials will improve Outcome: Progressing Goal: Emotional status will improve Outcome: Progressing Goal: Mental status will improve Outcome: Progressing Goal: Verbalization of understanding the information provided will improve Outcome: Progressing   Problem: Activity: Goal: Interest or engagement in activities will improve Outcome: Progressing Goal: Sleeping patterns will improve Outcome: Progressing   Problem: Coping: Goal: Ability to verbalize frustrations and anger appropriately will improve Outcome: Progressing Goal: Ability to demonstrate self-control will improve Outcome: Progressing   Problem: Health Behavior/Discharge Planning: Goal: Identification of resources available to assist in meeting health care needs will improve Outcome: Progressing Goal: Compliance with treatment plan for underlying cause of condition will improve Outcome: Progressing   Problem: Physical Regulation: Goal: Ability to maintain clinical measurements within normal limits will improve Outcome: Progressing   Problem: Safety: Goal: Periods of time without injury will increase Outcome: Progressing   Problem: Education: Goal: Utilization of techniques to improve thought processes will improve Outcome: Progressing Goal: Knowledge of the prescribed therapeutic regimen will improve Outcome: Progressing   Problem: Activity: Goal: Interest or engagement in leisure activities will improve Outcome: Progressing Goal: Imbalance in normal sleep/wake cycle will improve Outcome: Progressing   Problem: Coping: Goal: Coping ability will improve Outcome: Progressing Goal: Will verbalize feelings Outcome: Progressing   Problem: Health Behavior/Discharge Planning: Goal: Ability to make decisions will improve Outcome: Progressing Goal: Compliance with therapeutic regimen will improve Outcome: Progressing    Problem: Role Relationship: Goal: Will demonstrate positive changes in social behaviors and relationships Outcome: Progressing   Problem: Safety: Goal: Ability to disclose and discuss suicidal ideas will improve Outcome: Progressing Goal: Ability to identify and utilize support systems that promote safety will improve Outcome: Progressing   Problem: Self-Concept: Goal: Will verbalize positive feelings about self Outcome: Progressing Goal: Level of anxiety will decrease Outcome: Progressing   Problem: Education: Goal: Ability to make informed decisions regarding treatment will improve Outcome: Progressing   Problem: Coping: Goal: Coping ability will improve Outcome: Progressing   Problem: Health Behavior/Discharge Planning: Goal: Identification of resources available to assist in meeting health care needs will improve Outcome: Progressing   Problem: Medication: Goal: Compliance with prescribed medication regimen will improve Outcome: Progressing   Problem: Self-Concept: Goal: Ability to disclose and discuss suicidal ideas will improve Outcome: Progressing

## 2024-04-18 NOTE — BH Assessment (Signed)
(  Sleep Hours) - 7.5 (Any PRNs that were needed, meds refused, or side effects to meds)- Hydroxyzine  25 mg PO (Any disturbances and when (visitation, over night)- None (Concerns raised by the patient)- None (SI/HI/AVH)- None

## 2024-04-18 NOTE — Progress Notes (Signed)
 [facility] MD Progress Note  04/18/2024 2:59 PM Clinton Tucker  MRN:  [id number] Subjective:    Patient reports improving mood and anxiety and is brighter today. Denies SI, HI or AVH today. Patient has been attending groups and states he feels more hopeful. Reports low dose Risperdal  is helping and he would like to leave medications at current doses. Patient states he feels more hopeful and denies any intrusive thoughts. Denies urges or intrusive thoughts of buying memorabilia. Reports improving energy and fair appetite. Denies anhedonia. Patient denies SI, plan or intent today. Denies feeling he would be better of dead. Patient reports anxiety is significantly down and he feels the medications are working. Patient states he has been speaking with mother who is supportive. Patient states he would like to be discharged to mother tomorrow. Patient denies any access to guns or weapons. Interested in [facility] followup and states he will call and schedule appointment on Monday. I verified with social work that referral had been sent.  Called mother at [phone]. She denies any acute safety concerns and would like patient to have outpatient referral which I noted above. Denies any access to guns or weapons noting they are secured in safe. She is comfortable with patient discharging with her tomorrow.   Principal Problem: OCD (obsessive compulsive disorder) Diagnosis: Principal Problem:   OCD (obsessive compulsive disorder) Active Problems:   MDD (major depressive disorder), recurrent severe, without psychosis (HCC)  Total Time spent with patient: 30 minutes  Past Psychiatric History: see H&P  Past Medical History: History reviewed. No pertinent past medical history.  Past Surgical History:  Procedure Laterality Date   CYSTECTOMY     Family History:  Family History  Problem Relation Age of Onset   Diabetes Mother    Hypertension Mother    Diabetes Father    Hypertension Father    Family Psychiatric   History: see H&P Social History:  Social History   Substance and Sexual Activity  Alcohol Use Yes     Social History   Substance and Sexual Activity  Drug Use Never    Social History   Socioeconomic History   Marital status: Single    Spouse name: Not on file   Number of children: Not on file   Years of education: Not on file   Highest education level: Not on file  Occupational History   Not on file  Tobacco Use   Smoking status: Never   Smokeless tobacco: Never  Vaping Use   Vaping status: Never Used  Substance and Sexual Activity   Alcohol use: Yes   Drug use: Never   Sexual activity: Not on file  Other Topics Concern   Not on file  Social History Narrative   Not on file   Social Drivers of Health   Financial Resource Strain: Not on file  Food Insecurity: Food Insecurity Present (04/15/2024)   Hunger Vital Sign    Worried About Running Out of Food in the Last Year: Sometimes true    Ran Out of Food in the Last Year: Sometimes true  Transportation Needs: No Transportation Needs (04/15/2024)   PRAPARE - Administrator, Civil Service (Medical): No    Lack of Transportation (Non-Medical): No  Physical Activity: Not on file  Stress: Not on file  Social Connections: Unknown (11/07/2022)   Received from [facility]   Social Network    Social Network: Not on file   Additional Social History:  Sleep: Fair Estimated Sleeping Duration (Last 24 Hours): 6.00-7.50 hours  Appetite:  Fair  Current Medications: Current Facility-Administered Medications  Medication Dose Route Frequency Provider Last Rate Last Admin   acetaminophen  (TYLENOL ) tablet 650 mg  650 mg Oral Q6H PRN Nkwenti, Doris, NP       alum & mag hydroxide-simeth (MAALOX/MYLANTA) 200-200-20 MG/5ML suspension 30 mL  30 mL Oral Q4H PRN Nkwenti, Doris, NP       haloperidol  (HALDOL ) tablet 5 mg  5 mg Oral TID PRN Tex Drilling, NP       And   diphenhydrAMINE   (BENADRYL ) capsule 50 mg  50 mg Oral TID PRN Tex Drilling, NP       haloperidol  lactate (HALDOL ) injection 10 mg  10 mg Intramuscular TID PRN Tex Drilling, NP       And   diphenhydrAMINE  (BENADRYL ) injection 50 mg  50 mg Intramuscular TID PRN Tex Drilling, NP       And   LORazepam  (ATIVAN ) injection 2 mg  2 mg Intramuscular TID PRN Tex Drilling, NP       haloperidol  lactate (HALDOL ) injection 5 mg  5 mg Intramuscular TID PRN Tex Drilling, NP       And   diphenhydrAMINE  (BENADRYL ) injection 50 mg  50 mg Intramuscular TID PRN Tex Drilling, NP       And   LORazepam  (ATIVAN ) injection 2 mg  2 mg Intramuscular TID PRN Tex Drilling, NP       fluvoxaMINE  (LUVOX ) tablet 50 mg  50 mg Oral QHS Argil Mahl, MD       hydrOXYzine  (ATARAX ) tablet 25 mg  25 mg Oral TID PRN Tex Drilling, NP   25 mg at 04/17/24 2122   magnesium  hydroxide (MILK OF MAGNESIA) suspension 30 mL  30 mL Oral Daily PRN Tex Drilling, NP       risperiDONE  (RISPERDAL ) tablet 0.5 mg  0.5 mg Oral Q12H Tory Septer, MD   0.5 mg at 04/18/24 0805   traZODone  (DESYREL ) tablet 50 mg  50 mg Oral QHS PRN Tex Drilling, NP        Lab Results: No results found for this or any previous visit (from the past 48 hours).  Blood Alcohol level:  Lab Results  Component Value Date   [facility] <15 04/15/2024   ETH <5 12/10/2016    Metabolic Disorder Labs: Lab Results  Component Value Date   HGBA1C 5.2 04/15/2024   MPG 103 04/15/2024   No results found for: PROLACTIN Lab Results  Component Value Date   CHOL 213 (H) 04/15/2024   TRIG 105 04/15/2024   HDL 59 04/15/2024   CHOLHDL 3.6 04/15/2024   VLDL 21 04/15/2024   LDLCALC 133 (H) 04/15/2024     Musculoskeletal: Strength & Muscle Tone: within normal limits Gait & Station: normal Patient leans: N/A  Psychiatric Specialty Exam:  Presentation  General Appearance:  Appropriate for Environment  Eye Contact: Fair  Speech: Clear and Coherent  Speech  Volume: Normal  Handedness: Right   Mood and Affect  Mood: Euthymic  Affect: Appropriate   Thought Process  Thought Processes: Goal Directed  Descriptions of Associations:Intact  Orientation:Full (Time, Place and Person)  Thought Content:Logical  History of Schizophrenia/Schizoaffective disorder:No  Duration of Psychotic Symptoms:denies Hallucinations:Hallucinations: None  Ideas of Reference:None  Suicidal Thoughts:Suicidal Thoughts: No  Homicidal Thoughts:Homicidal Thoughts: No   Sensorium  Memory: Immediate Fair  Judgment: Fair  Insight: Fair   Art therapist  Concentration: Fair  Attention Span: Fair  Recall: Dotti Abe of Knowledge: Fair  Language: Fair   Psychomotor Activity  Psychomotor Activity: Psychomotor Activity: Normal   Assets  Assets: Communication Skills; Desire for Improvement; Physical Health; Leisure Time; Social Support   Sleep  Sleep: Sleep: Fair    Physical Exam: Physical Exam ROS Blood pressure 120/78, pulse 71, temperature 97.8 F (36.6 C), temperature source Oral, resp. rate 16, height 5' 9 (1.753 m), weight 86.8 kg, SpO2 100%. Body mass index is 28.26 kg/m.   Treatment Plan Summary: Daily contact with patient to assess and evaluate symptoms and progress in treatment, Medication management, and Plan see below   OCD (obsessive compulsive disorder)  MDD, severe recurrent without psychotic features   Patient presents primarily with symptoms of a major depressive episode after acruing 20-30k in credit card debt. Describes symptoms of obsessive intrusive thoughts of spending money on memorobilia that are fairly consistent with OCD or a compulsive disorder. Patient does not get relief until he buys items and then feels depressed and suicidal as a result of his debt. I dicussed that he would benefit from treatment for both OCD and MDD and discussed exposure response prevention therapy. Patient states  he feels better today than yesterday and denies SI today. Agreeable to Luvox  titration. Patient does have history of a suicide attempt 10 years ago.   8/8: patient presents with depression and anxiety. Denies SI, HI or AVH today. Reports high anxiety and racing thoughts but states he feels more hopeful today. Patient is interested in trying low dose Risperdal  for augmentation. Discussed it is also most evidence based medication in OCD of atypical antipsychotics for targeting intrusive thoughts.   8/9: patient appears euthymic today and denies anxiety. Reports benefit from medications. Denies SI or thoughts of self harm. Denies feeling hopeless. Future oriented on discharging with mother tomorrow and outpatient followup with [facility].   Treatment Plan Summary: Daily contact with patient to assess and evaluate symptoms and progress in treatment, Medication management, and Plan as below   Observation Level/Precautions:  15 minute checks  Laboratory:  CBC Chemistry Profile Folic Acid HbAIC UDS Vitamin B-12  Psychotherapy:    Medications:  Luvox , trazodone , hydroxyzine   Consultations:    Discharge Concerns:  suicidality  Estimated LOS:5-7 days  Other:      Safety and Monitoring: voluntarily admission to inpatient psychiatric unit for safety, stabilization and treatment Daily contact with patient to assess and evaluate symptoms and progress in treatment Patient's case to be discussed in multi-disciplinary team meeting Observation Level : q15 minute checks Vital signs: q12 hours Precautions: suicide, elopement, and assault   2. Psychiatric Problems #MDD, severe recurrent without psychotic features -cont Luvox  50 mg qdaily -start Risperdal  0.5 mg BID for augmentation as well as treatment of anxiety and intrusive thoughts of spending excessive money on memorobilia -continue trazodone  50 mg at bedtime-PRN - cont hydroxyzine  25 mg TID-PRN for anxiety   #OCD -cont Luvox  50 mg qdaily -cont  Risperdal  0.5 mg BID for augmentation as well as treatment of anxiety and intrusive thoughts of spending excessive money on memorobilia -continue trazodone  50 mg at bedtime-PRN - cont hydroxyzine  25 mg TID-PRN for anxiety   3. Medical Management Covid negative CMP: wnl CBC: unremarkable EtOH: <10 UDS:neg TSH: wnl A1C: 5.2 Lipids: total chol is 213, LDL of 133 B12 is 234, started on replacement   #denies medical conditions   Physician Treatment Plan for Primary Diagnosis: OCD (obsessive compulsive disorder) Long Term Goal(s): Improvement in symptoms so as ready for  discharge   Short Term Goals: Ability to identify changes in lifestyle to reduce recurrence of condition will improve, Ability to verbalize feelings will improve, Ability to disclose and discuss suicidal ideas, Ability to demonstrate self-control will improve, Ability to identify and develop effective coping behaviors will improve, Ability to maintain clinical measurements within normal limits will improve, Compliance with prescribed medications will improve, and Ability to identify triggers associated with substance abuse/mental health issues will improve   Physician Treatment Plan for Secondary Diagnosis: Principal Problem:   OCD (obsessive compulsive disorder)       I certify that inpatient services furnished can reasonably be expected to improve the patient's condition.    Kayl Stogdill, MD 04/18/2024, 2:59 PM

## 2024-04-18 NOTE — Progress Notes (Signed)
 D: Patient is alert, oriented, pleasant, and cooperative. Denies SI, HI, AVH, and verbally contracts for safety. Patient reports he slept good last night with sleeping medication. Patient reports his appetite as good, energy level as normal, and concentration as good. Patient rates his depression 3/10, hopelessness 3/10, and anxiety 3/10. Patient denies physical symptoms/pain.    A: Scheduled medications administered per MD order. Support provided. Patient educated on safety on the unit and medications. Routine safety checks every 15 minutes. Patient stated understanding to tell nurse about any new physical symptoms. Patient understands to tell staff of any needs.     R: No adverse drug reactions noted. Patient remains safe at this time and will continue to monitor.    04/18/24 0800  Psych Admission Type (Psych Patients Only)  Admission Status Voluntary  Psychosocial Assessment  Patient Complaints None  Eye Contact Fair  Facial Expression Animated  Affect Appropriate to circumstance  Speech Soft  Interaction Assertive  Motor Activity Other (Comment) (WNL)  Appearance/Hygiene Unremarkable;In scrubs  Behavior Characteristics Appropriate to situation  Mood Pleasant  Thought Process  Coherency WDL  Content WDL  Delusions None reported or observed  Perception WDL  Hallucination None reported or observed  Judgment Limited  Confusion None  Danger to Self  Current suicidal ideation? Denies  Agreement Not to Harm Self Yes  Description of Agreement verbal  Danger to Others  Danger to Others None reported or observed

## 2024-04-18 NOTE — Plan of Care (Signed)

## 2024-04-19 DIAGNOSIS — F429 Obsessive-compulsive disorder, unspecified: Secondary | ICD-10-CM

## 2024-04-19 DIAGNOSIS — F332 Major depressive disorder, recurrent severe without psychotic features: Principal | ICD-10-CM

## 2024-04-19 MED ORDER — FLUVOXAMINE MALEATE 50 MG PO TABS: 50.0000 mg | ORAL_TABLET | Freq: Every day | ORAL | 0 refills | Status: AC

## 2024-04-19 MED ORDER — RISPERIDONE 0.5 MG PO TABS: 0.5000 mg | ORAL_TABLET | Freq: Two times a day (BID) | ORAL | 0 refills | Status: AC

## 2024-04-19 MED ORDER — HYDROXYZINE HCL 25 MG PO TABS: 25.0000 mg | ORAL_TABLET | Freq: Three times a day (TID) | ORAL | 0 refills | Status: AC | PRN

## 2024-04-19 MED ORDER — TRAZODONE HCL 50 MG PO TABS: 50.0000 mg | ORAL_TABLET | Freq: Every evening | ORAL | 0 refills | Status: AC | PRN

## 2024-04-19 NOTE — Progress Notes (Signed)
  [facility] Adult Case Management Discharge Plan :  Will you be returning to the same living situation after discharge:  Yes,  Patient to return home.  At discharge, do you have transportation home?: Yes,  Patient's mother to provide transportation.  Do you have the ability to pay for your medications: Yes,  BLUE CROSS BLUE SHIELD / BCBS COMM PPO  Release of information consent forms completed and in the chart;  Patient's signature needed at discharge.  Patient to Follow up at:  Follow-up Information     [facility] Go on 04/20/2024.   Why: A referral has been made to this provider.  Please call on 04/20/24 at 8:00 am to schedule a hospital follow up appointment, to obtain therapy and medication management services. Contact information: [address] [place] [address] [phone]                 Next level of care provider has access to [facility] Link:no  Safety Planning and Suicide Prevention discussed: Yes,  Education Completed; Clinton Tucker, has been identified by the patient as the family member/significant other with whom the patient will be residing, and identified as the person(s) who will aid the patient in the event of a mental health crisis (suicidal ideations/suicide attempt).  With written consent from the patient, the family member/significant other has been provided the following suicide prevention education, prior to the and/or following the discharge of the patient.  Has patient been referred to the Quitline?: Patient does not use tobacco/nicotine products  Patient has been referred for addiction treatment: No known substance use disorder.  Clinton CHRISTELLA Kerns, LCSW 04/19/2024, 9:10 AM

## 2024-04-19 NOTE — Plan of Care (Signed)
  Problem: Activity: Goal: Interest or engagement in activities will improve Outcome: Progressing   Problem: Safety: Goal: Periods of time without injury will increase Outcome: Progressing

## 2024-04-19 NOTE — Progress Notes (Signed)
 Adult Psychoeducational Group Note  Date:  04/19/2024 Time:  10:26 AM  Group Topic/Focus:  Goals Group:   The focus of this group is to help patients establish daily goals to achieve during treatment and discuss how the patient can incorporate goal setting into their daily lives to aide in recovery.  Participation Level:  Active  Participation Quality:  Appropriate  Affect:  Appropriate  Cognitive:  Appropriate  Insight: Appropriate  Engagement in Group:  Engaged  Modes of Intervention:  Discussion  Additional Comments:  Pt stated he is feeling well.  Pt goal for the day is to be discharged.  Daine Pillar D 04/19/2024, 10:26 AM

## 2024-04-19 NOTE — Progress Notes (Signed)
 Patient verbalizes readiness for discharge. All patient belongings returned to patient. Discharge instructions read and discussed with patient (appointments, medications, resources). Patient expressed gratitude for care provided. Patient discharged to lobby at 1140 where his mom was waiting.

## 2024-04-19 NOTE — [facility] Suicide Risk Assessment (Signed)
 [facility] INPATIENT:  Family/Significant Other Suicide Prevention Education  Suicide Prevention Education:  Education Completed; Clinton Tucker, has been identified by the patient as the family member/significant other with whom the patient will be residing, and identified as the person(s) who will aid the patient in the event of a mental health crisis (suicidal ideations/suicide attempt).  With written consent from the patient, the family member/significant other has been provided the following suicide prevention education, prior to the and/or following the discharge of the patient.  The suicide prevention education provided includes the following: Suicide risk factors Suicide prevention and interventions National Suicide Hotline telephone number [facility] assessment telephone number [facility] Emergency Assistance 911 [facility] and/or Residential Mobile Crisis Unit telephone number  Request made of family/significant other to: Remove weapons (e.g., guns, rifles, knives), all items previously/currently identified as safety concern.   Remove drugs/medications (over-the-counter, prescriptions, illicit drugs), all items previously/currently identified as a safety concern.  The family member/significant other verbalizes understanding of the suicide prevention education information provided.  The family member/significant other agrees to remove the items of safety concern listed above.  Mother confirmed that patient does not have access to weapons in the home. Mother reported that she does not believe the patient to be a danger to himself or others. Mother to provide transportation at discharge and confirmed that patient can return to the home. Mother expressed no safety concerns at this time. Mother reported being in agreement with current discharge plan.    Clinton Tucker 04/19/2024, 9:13 AM

## 2024-04-19 NOTE — [facility] Suicide Risk Assessment (Signed)
 [facility] Discharge Suicide Risk Assessment   Principal Problem: OCD (obsessive compulsive disorder) Discharge Diagnoses: Principal Problem:   OCD (obsessive compulsive disorder) Active Problems:   MDD (major depressive disorder), recurrent severe, without psychosis (HCC)   Total Time spent with patient: 30 minutes  Musculoskeletal: Strength & Muscle Tone: within normal limits Gait & Station: normal Patient leans: N/A  Psychiatric Specialty Exam  Presentation  General Appearance:  Appropriate for Environment  Eye Contact: Fair  Speech: Clear and Coherent  Speech Volume: Normal  Handedness: Right   Mood and Affect  Mood: Euthymic  Duration of Depression Symptoms: Greater than two weeks  Affect: Congruent   Thought Process  Thought Processes: Goal Directed  Descriptions of Associations:Intact  Orientation:Full (Time, Place and Person)  Thought Content:Logical  History of Schizophrenia/Schizoaffective disorder:No  Duration of Psychotic Symptoms:No data recorded Hallucinations:Hallucinations: None  Ideas of Reference:None  Suicidal Thoughts:Suicidal Thoughts: No  Homicidal Thoughts:Homicidal Thoughts: No   Sensorium  Memory: Immediate Fair  Judgment: Fair  Insight: Fair   Art therapist  Concentration: Fair  Attention Span: Fair  Recall: [employer] of Knowledge: Fair  Language: Fair   Psychomotor Activity  Psychomotor Activity: Psychomotor Activity: Normal   Assets  Assets: Communication Skills; Desire for Improvement; Housing; Leisure Time; Resilience; Physical Health; Social Support; Vocational/Educational   Sleep  Sleep: Sleep: Fair  Estimated Sleeping Duration (Last 24 Hours): 4.50-6.00 hours  Physical Exam: Physical Exam ROS Blood pressure (!) 136/94, pulse 82, temperature 97.9 F (36.6 C), temperature source Oral, resp. rate 16, height 5' 9 (1.753 m), weight 86.8 kg, SpO2 100%. Body mass index is  28.26 kg/m.  Mental Status Per Nursing Assessment::   On Admission:  Suicidal ideation indicated by patient  Demographic Factors:  Male, Caucasian, and Low socioeconomic status  Loss Factors: Decrease in vocational status and Financial problems/change in socioeconomic status  Historical Factors: Prior suicide attempts  Risk Reduction Factors:   Sense of responsibility to family, Employed, Living with another person, especially a relative, Positive social support, Positive therapeutic relationship, and Positive coping skills or problem solving skills  Continued Clinical Symptoms:  Patient denies symptoms of depression or OCD the last 2 days  Cognitive Features That Contribute To Risk:  None    Suicide Risk:  Minimal: No identifiable suicidal ideation.  Patients presenting with no risk factors but with morbid ruminations; may be classified as minimal risk based on the severity of the depressive symptoms  Patient denies SI or HI for >48 hours. Denies wanting to be dead and future oriented. SRA complete and acute risk for suicide is low.   [place] Suicide Risk assessment:  1. Do you wish to be dead? NONE REPORTED 2. Have you wished your dead or wished you could go to sleep and not wake up? NONE REPORTED 3.  Have you actually had thoughts of killing yourself?  NONE REPORTED 4.  Have you been thinking about how you might do this?  NONE REPORTED 5.  Have you had these thoughts and some intention of acting on them? NONE REPORTED 6.  Have you started to work out or worked out the details to kill yourself? NONE REPORTED 7.  Do you intend to carry out this plan? NONE REPORTED 8. On a scale of 1-5 with 1 being the least severe and 5 being the most severe answer the following questions place for intensity of ideation. ZERO 9. How many times have you had these thoughts? NONE REPORTED 10. When you have the thoughts how  long to the last?  NONE REPORTED 11. Control ability.  Could you or can  you stop thinking about killing herself or wanting to die if you want to?  YES 12. Are there any things anyone or anything family religion pain of death that stop you from wanting to die or acting on thoughts of committing suicide?  FAMILY 13.  What sort of reason to do have to think about wanting to die or killing yourself? NONE REPORTED 14.Was it to end the pain or stop the way you are feeling in other words you could not go on living with his pain or how you are feeling or was not to get attention revenge or reaction from others?  Or both?  NONE REPORTED  [place] Suicide Risk assessment:  1. Do you wish to be dead? NONE REPORTED 2. Have you wished your dead or wished you could go to sleep and not wake up? NONE REPORTED 3.  Have you actually had thoughts of killing yourself?  NONE REPORTED 4.  Have you been thinking about how you might do this?  NONE REPORTED 5.  Have you had these thoughts and some intention of acting on them? NONE REPORTED 6.  Have you started to work out or worked out the details to kill yourself? NONE REPORTED 7.  Do you intend to carry out this plan? NONE REPORTED 8. On a scale of 1-5 with 1 being the least severe and 5 being the most severe answer the following questions place for intensity of ideation. ZERO 9. How many times have you had these thoughts? NONE REPORTED 10. When you have the thoughts how long to the last?  NONE REPORTED 11. Control ability.  Could you or can you stop thinking about killing herself or wanting to die if you want to?  YES 12. Are there any things anyone or anything family religion pain of death that stop you from wanting to die or acting on thoughts of committing suicide?  my mother 95.  What sort of reason to do have to think about wanting to die or killing yourself? NONE REPORTED 14.Was it to end the pain or stop the way you are feeling in other words you could not go on living with his pain or how you are feeling or was not to get  attention revenge or reaction from others?  Or both?  NONE REPORTED    Follow-up Information     [employer] Go on 04/20/2024.   Why: A referral has been made to this provider.  Please call on 04/20/24 at 8:00 am to schedule a hospital follow up appointment, to obtain therapy and medication management services. Contact information: [address] [place] [address] [phone]                 Plan Of Care/Follow-up recommendations:  Followup as above discharge home with mother  Bernadette Armijo, MD 04/19/2024, 9:42 AM

## 2024-04-19 NOTE — Discharge Summary (Signed)
 Physician Discharge Summary Note  Patient:  Clinton Tucker is an 38 y.o., male MRN:  [id number] DOB:  1986-06-21 Patient phone:  [phone] (home)  Patient address:   [address] [place] [address],  Total Time spent with patient: 30 minutes  Date of Admission:  04/15/2024 Date of Discharge: 04/19/24  Reason for Admission:   As per H&P:  38 y.o. male with a reported PPH of bipolar 2 d/o who presented to the [facility] this day with complaints of worsening depressive symptoms & SI with a plan to overdose on OTC meds.    PER psych eval yesterday at [facility] :On assessment, patient reports that he called the suicide hotline number today, due to worsening intrusive thoughts of suicide.  He shares that he has been depressed since he was in elementary school.  Recalls being depressed since age 34 years old.  Reports that he was last on meds >10 yrs ago for bipolar 2 d/o, shares that he was on Lamictal as well as another medication which he is unable to recall name. States that medication led to so much weight gain that he was forced. to stop them, and has not been on any others.Patient describes symptoms that are more significant for OCD & MDD, rather than bipolar d/o; he describes persistent, and recurrent intrusive thoughts to get football paraphernalia, is able to recognize that these thoughts are irrational, and that he should not be indulging in them, yet, he is unable to stop himself from compulsively & impulsively purchasing these items. He reports that the compulsive shopping for paraphernalia has been what has been elevating his mood,  but it has been most recently, causing him financial difficulties. He shares that he woke up today morning to find out that his bank account was overdrafted by $200, but caused his suicidal thoughts to worsen, rendering him to call the suicide hotline number, leading to the presentation at this location.  Patient reports insomnia, sleeping for 4 to 5 hours  nightly, anhedonia, states only thing that makes him happy with the compulsive shopping, but he cannot do that anymore due to financial issues.  He reports feelings of guilt regarding his excessive spending.  Reports decreased energy levels, poor concentration levels, decreased appetite, describes symptoms consistent with psychomotor retardation; difficulty getting his mind coordinated with his physical body to do things that positively impact him.  He reports worsening mental clouding, as well as intrusive suicidal thoughts.  Reports that the symptoms above have been going on for at least the past year, worsening in the past few months. Patient describes symptoms significant for GAD and panic  disorder; reports restlessness, excessive worrying, as well as muscle tension.  Reports situations where he feels like he cannot breath, where his mind is in a fight or flight mode, reports having panic attacks while driving at times, rendering him to pull car by the side of the road.  Patient reports auditory hallucinations, but on further assessment, it seems more like intrusive thoughts, racing in nature as well as some internal dialogue; he reports negative thoughts, such as thinking of himself as worthless, reports feeling scared due to this.  He denies visual hallucinations, denies paranoia, does not seem to be responding to any internal or external stimuli.  Denies first rank symptoms.  There are no overt signs of psychosis.Patient reports a past suicide attempt, states it was 10 years ago, when he used a rope to time on his neck, and has been planning  to hang himself, but  shortly before kicking off what he was standing on, he changed his mind.  He denies any other attempts in the past, denies any  mental health related hospitalizations, denies that he currently has a mental health provider or therapist.Patient denies substance use specifically denies using alcohol, marijuana, cigarettes, or any other substances  not mentioned. Patient reports finances as being his primary stressor, states that he owes 20,00- $30,000 in credit card debts.  Reports another stressor as the inability to get along with his father and older brother.  He reports that he is moderatelysupportive, and his immediate younger brother is supportive. Reports ,feeling like he is a burden to his family. Reports that his financial problems are causing him problems with his family, and in his personal relationships.Reports his protective factor as being his younger brother's child who is 50 years old.States he has no access to a gun.    Patient confirms above history reporting primarily financial stressors of 20-30 k  of debt. States since  38 years old he has had intrusive thoughts of purchasing items online and that I feel like I'm not enough If I don't purchase them... then I get a momentary high for a while. Patient denies any history of symptoms consistent with manic episodes. Reports symptoms over the past 2 months consistent with a major depressive episode. States he was having suicidal thoughts yesterday and has 1 prior suicide attempt 10 years ago where he thought about hanging himself but decided not to. Patient states he was prescribed lamictal and another mood stabilizer but denied any benefit despite taking for 6 months. States they just took my joy away and made me gain weight.  Patient denies AVH. Denies paranoia or any delusions. Denies PTSD symptoms.   Principal Problem: OCD (obsessive compulsive disorder) Discharge Diagnoses: Principal Problem:   OCD (obsessive compulsive disorder) Active Problems:   MDD (major depressive disorder), recurrent severe, without psychosis (HCC)   Past Psychiatric History:  Previous Psych Diagnoses: Bipolar, depression anxiety Prior inpatient treatment: denies Current/prior outpatient treatment: denies Prior rehab hx: denies Psychotherapy hx:  10 years ago History of suicide:10 years ago,  planned to hang himself but decided not to History of homicide: denies Psychiatric medication history: lamictal 10 years ago Not following outpatient currenlty    Past Medical History: History reviewed. No pertinent past medical history.  Past Surgical History:  Procedure Laterality Date   CYSTECTOMY     Family History:  Family History  Problem Relation Age of Onset   Diabetes Mother    Hypertension Mother    Diabetes Father    Hypertension Father    Family Psychiatric  History:  Patient reports a history of suicide attempt in his paternal grandfather, states that he completed suicide in 1 by shooting himself with a gun  Social History:  Social History   Substance and Sexual Activity  Alcohol Use Yes     Social History   Substance and Sexual Activity  Drug Use Never    Social History   Socioeconomic History   Marital status: Single    Spouse name: Not on file   Number of children: Not on file   Years of education: Not on file   Highest education level: Not on file  Occupational History   Not on file  Tobacco Use   Smoking status: Never   Smokeless tobacco: Never  Vaping Use   Vaping status: Never Used  Substance and Sexual Activity   Alcohol use:  Yes   Drug use: Never   Sexual activity: Not on file  Other Topics Concern   Not on file  Social History Narrative   Not on file   Social Drivers of Health   Financial Resource Strain: Not on file  Food Insecurity: Food Insecurity Present (04/15/2024)   Hunger Vital Sign    Worried About Running Out of Food in the Last Year: Sometimes true    Ran Out of Food in the Last Year: Sometimes true  Transportation Needs: No Transportation Needs (04/15/2024)   PRAPARE - Administrator, Civil Service (Medical): No    Lack of Transportation (Non-Medical): No  Physical Activity: Not on file  Stress: Not on file  Social Connections: Unknown (11/07/2022)   Received from [facility]   Social Network     Social Network: Not on file    Hospital Course:   Patient was admitted to inpatient psychiatry at [facility] for safety and stabilization. Patient was provided safe and therapeutic milieu, psychiatric and medical assessment, care and treatment, as well as support from nursing, behavioral health staff. Both psychotherapy and psychoeducation groups were provided. Different coping skills such as journaling, CBT and art therapy groups were offered. Additional consultation was provided by hospitalist for H&P and medical needs.  Patient was started on Luvox  50 mg qhs during the admission for OCD and MDD and Risperdal  0.5 mg BID for augmentation of Luvox  and for anxiety in setting of OCD. Patient tolerated without side effects and medications were titrated to therapeutic effect. As patient stabilized on medications and participated in therapeutic interventions, symptoms began to improve. Patient intrusive thoughts of spending money on memorobilia and anxiety resolved for >2 days prior to discharge. He was euthymic for 2 days, interactive with peers and attending groups and smiling. Patient denied SI or HI for >48 hours prior to discharge. I called his mother for collateral who stated he could stay with her and denied any access to guns or weapons. Denied any acute safety concerns and we discussed referral to [facility] was placed for psychiatric services and therapy.   On the day of discharge, the chart was reviewed, case was discussed with staff and the patient was seen in person. Patient's overall mood has improved. Reports feeling really good, no depression or anxiety.  Patient denies feeling hopeless. Denies wanting to be dead and states I want to live for my mom and I want to pay off my debt. Patient was calm and cooperative and did not appear anxious. Patient reported adequate sleep and stable mood. Patient was tolerating medications well without side effects reported or noted. Patient denied suicidal  ideation, plan or intent, denied hopelessness, helplessness or worthlessness, and denied homicidal ideation. Insight and judgement have improved. Patient demonstrated future orientation and was motivated to follow-up with aftercare. Patient was encouraged to be adherent with medications. Patient was instructed to call 911, ask for help to go to the closest emergency room or crisis center, call crisis hotlines for help if in critical status or when symptoms were worsening. Patient voiced understanding of this information. At the time of discharge, patient had reached maximum benefit from hospitalization, was no longer considered to be dangerous to self or others, and was psychiatrically stable and otherwise appropriate for discharge to less restrictive care in the community.   Medical Hospital Course: Patient was seen by the hospitalist for routine admission examination. Medications for chronic conditions were continued.  Medical hospital course was otherwise unremarkable.  Musculoskeletal: Strength & Muscle Tone: within normal limits Gait & Station: normal Patient leans: N/A   Psychiatric Specialty Exam:  Presentation  General Appearance:  Appropriate for Environment  Eye Contact: Fair  Speech: Clear and Coherent  Speech Volume: Normal  Handedness: Right   Mood and Affect  Mood: Euthymic  Affect: Congruent   Thought Process  Thought Processes: Goal Directed  Descriptions of Associations:Intact  Orientation:Full (Time, Place and Person)  Thought Content:Logical  History of Schizophrenia/Schizoaffective disorder:No  Duration of Psychotic Symptoms:No data recorded Hallucinations:Hallucinations: None  Ideas of Reference:None  Suicidal Thoughts:Suicidal Thoughts: No  Homicidal Thoughts:Homicidal Thoughts: No   Sensorium  Memory: Immediate Fair  Judgment: Fair  Insight: Fair   Art therapist  Concentration: Fair  Attention  Span: Fair  Recall: [employer] of Knowledge: Fair  Language: Fair   Psychomotor Activity  Psychomotor Activity: Psychomotor Activity: Normal   Assets  Assets: Communication Skills; Desire for Improvement; Housing; Leisure Time; Resilience; Physical Health; Social Support; Vocational/Educational   Sleep  Sleep: Sleep: Fair  Estimated Sleeping Duration (Last 24 Hours): 4.50-6.00 hours   Physical Exam: General: Well developed, well nourished, balding Caucasian male Pupils: Normal at 3mm Respiratory: Breathing is unlabored.  Cardiovascular: No edema.  Language: No anomia, no aphasia Muscle strength and tone-pt moving all extremities.  Gait not assessed as pt remained in bed.  Neuro: Facial muscles are symmetric. Pt without tremor, no evidence of hyperarousal.   Review of Systems  Constitutional: Negative.   HENT: Negative.    Eyes: Negative.   Respiratory: Negative.    Cardiovascular: Negative.   Gastrointestinal: Negative.   Genitourinary: Negative.   Musculoskeletal: Negative.   Skin: Negative.   Psychiatric/Behavioral:  negative for depression and negative for suicidal ideas. The patient is negative for  nervous/anxious and does not have insomnia.   Blood pressure (!) 136/94, pulse 82, temperature 97.9 F (36.6 C), temperature source Oral, resp. rate 16, height 5' 9 (1.753 m), weight 86.8 kg, SpO2 100%. Body mass index is 28.26 kg/m.   Social History   Tobacco Use  Smoking Status Never  Smokeless Tobacco Never   Tobacco Cessation:  A prescription for an FDA-approved tobacco cessation medication provided at discharge   Blood Alcohol level:  Lab Results  Component Value Date   [facility] <15 04/15/2024   ETH <5 12/10/2016    Metabolic Disorder Labs:  Lab Results  Component Value Date   HGBA1C 5.2 04/15/2024   MPG 103 04/15/2024   No results found for: PROLACTIN Lab Results  Component Value Date   CHOL 213 (H) 04/15/2024   TRIG 105 04/15/2024    HDL 59 04/15/2024   CHOLHDL 3.6 04/15/2024   VLDL 21 04/15/2024   LDLCALC 133 (H) 04/15/2024    See Psychiatric Specialty Exam and Suicide Risk Assessment completed by Attending Physician prior to discharge.  Discharge destination:  Home  Is patient on multiple antipsychotic therapies at discharge:  No   Has Patient had three or more failed trials of antipsychotic monotherapy by history:  No  Recommended Plan for Multiple Antipsychotic Therapies: NA   Allergies as of 04/19/2024   No Known Allergies      Medication List     TAKE these medications      Indication  fluvoxaMINE  50 MG tablet Commonly known as: LUVOX  Take 1 tablet (50 mg total) by mouth at bedtime.  Indication: Generalized Anxiety Disorder, Major Depressive Disorder, Obsessive Compulsive Disorder   hydrOXYzine  25 MG tablet Commonly known as: ATARAX   Take 1 tablet (25 mg total) by mouth 3 (three) times daily as needed for anxiety.  Indication: Feeling Anxious   ibuprofen 200 MG tablet Commonly known as: ADVIL Take 800 mg by mouth every 6 (six) hours as needed (For headache or pain).  Indication: Pain   risperiDONE  0.5 MG tablet Commonly known as: RISPERDAL  Take 1 tablet (0.5 mg total) by mouth every 12 (twelve) hours.  Indication: Obsessive Compulsive Disorder   traZODone  50 MG tablet Commonly known as: DESYREL  Take 1 tablet (50 mg total) by mouth at bedtime as needed for sleep.  Indication: Trouble Sleeping        Follow-up Information     [employer] Go on 04/20/2024.   Why: A referral has been made to this provider.  Please call on 04/20/24 at 8:00 am to schedule a hospital follow up appointment, to obtain therapy and medication management services. Contact information: [address] [place] [address] [phone]                 Follow-up recommendations:  [facility] psychiatryc and therapy referral place Patient requesting discharge today and no longer meets IVC  criteria, instructed with social work to call on Monday morning at number above for appointment - patient will discharge home with mother and stay with her  Signed: Deion Forgue, MD 04/19/2024, 9:31 AM

## 2024-04-19 NOTE — Progress Notes (Signed)
(  Sleep Hours) - 6.75 (Any PRNs that were needed, meds refused, or side effects to meds)-  PRN Hydroxyzine  and PRN Trazodone  (Any disturbances and when (visitation, over night)- None (Concerns raised by the patient)- None (SI/HI/AVH)- Denies. Contracts for safety.

## 2024-06-03 NOTE — Telephone Encounter (Signed)
 Pt was at Pharmacy today w/a hard copy Rx for Doxycycline  100 mg BID x 1 week w/no refills. This was given to pt on 06/23/2020 for cyst, if it worsens. Pharm. Was calling to verify it.
